# Patient Record
Sex: Female | Born: 1985 | Hispanic: Yes | Smoking: Never smoker
Health system: Southern US, Community
[De-identification: ages and names within clinical notes are randomized; demographics above are authoritative.]

## PROBLEM LIST (undated history)

## (undated) ENCOUNTER — Inpatient Hospital Stay (HOSPITAL_COMMUNITY): Payer: Self-pay

## (undated) ENCOUNTER — Emergency Department (HOSPITAL_COMMUNITY): Admission: EM | Disposition: A | Discharge status: Home / Self Care | Payer: Self-pay

## (undated) DIAGNOSIS — G43909 Migraine, unspecified, not intractable, without status migrainosus: Secondary | ICD-10-CM

## (undated) DIAGNOSIS — R3915 Urgency of urination: Secondary | ICD-10-CM

## (undated) DIAGNOSIS — K219 Gastro-esophageal reflux disease without esophagitis: Secondary | ICD-10-CM

## (undated) DIAGNOSIS — R350 Frequency of micturition: Secondary | ICD-10-CM

## (undated) DIAGNOSIS — R351 Nocturia: Secondary | ICD-10-CM

## (undated) DIAGNOSIS — M797 Fibromyalgia: Secondary | ICD-10-CM

## (undated) DIAGNOSIS — Z9889 Other specified postprocedural states: Secondary | ICD-10-CM

## (undated) DIAGNOSIS — N979 Female infertility, unspecified: Secondary | ICD-10-CM

## (undated) DIAGNOSIS — N3010 Interstitial cystitis (chronic) without hematuria: Secondary | ICD-10-CM

## (undated) DIAGNOSIS — M26629 Arthralgia of temporomandibular joint, unspecified side: Secondary | ICD-10-CM

## (undated) DIAGNOSIS — R112 Nausea with vomiting, unspecified: Secondary | ICD-10-CM

## (undated) DIAGNOSIS — L299 Pruritus, unspecified: Secondary | ICD-10-CM

## (undated) HISTORY — PX: OTHER SURGICAL HISTORY: SHX169

---

## 2005-12-25 ENCOUNTER — Other Ambulatory Visit: Admission: RE | Admit: 2005-12-25 | Discharge: 2005-12-25 | Payer: Self-pay | Admitting: Obstetrics and Gynecology

## 2006-01-11 ENCOUNTER — Inpatient Hospital Stay (HOSPITAL_COMMUNITY): Admission: AD | Admit: 2006-01-11 | Discharge: 2006-01-11 | Payer: Self-pay | Admitting: Obstetrics and Gynecology

## 2006-02-13 ENCOUNTER — Inpatient Hospital Stay (HOSPITAL_COMMUNITY): Admission: AD | Admit: 2006-02-13 | Discharge: 2006-02-13 | Payer: Self-pay | Admitting: Obstetrics and Gynecology

## 2006-04-19 ENCOUNTER — Inpatient Hospital Stay (HOSPITAL_COMMUNITY): Admission: AD | Admit: 2006-04-19 | Discharge: 2006-04-19 | Payer: Self-pay | Admitting: Obstetrics and Gynecology

## 2006-06-04 ENCOUNTER — Inpatient Hospital Stay (HOSPITAL_COMMUNITY): Admission: AD | Admit: 2006-06-04 | Discharge: 2006-06-05 | Payer: Self-pay | Admitting: Obstetrics and Gynecology

## 2006-07-06 ENCOUNTER — Inpatient Hospital Stay (HOSPITAL_COMMUNITY): Admission: AD | Admit: 2006-07-06 | Discharge: 2006-07-06 | Payer: Self-pay | Admitting: Obstetrics and Gynecology

## 2006-08-10 ENCOUNTER — Ambulatory Visit (HOSPITAL_COMMUNITY): Admission: RE | Admit: 2006-08-10 | Discharge: 2006-08-10 | Payer: Self-pay | Admitting: Obstetrics and Gynecology

## 2006-08-12 ENCOUNTER — Inpatient Hospital Stay (HOSPITAL_COMMUNITY): Admission: AD | Admit: 2006-08-12 | Discharge: 2006-08-15 | Payer: Self-pay | Admitting: Obstetrics and Gynecology

## 2006-08-12 ENCOUNTER — Encounter (INDEPENDENT_AMBULATORY_CARE_PROVIDER_SITE_OTHER): Payer: Self-pay | Admitting: Specialist

## 2006-08-16 ENCOUNTER — Inpatient Hospital Stay (HOSPITAL_COMMUNITY): Admission: AD | Admit: 2006-08-16 | Discharge: 2006-08-19 | Payer: Self-pay | Admitting: Obstetrics and Gynecology

## 2006-09-24 ENCOUNTER — Other Ambulatory Visit: Admission: RE | Admit: 2006-09-24 | Discharge: 2006-09-24 | Payer: Self-pay | Admitting: Obstetrics and Gynecology

## 2006-11-09 ENCOUNTER — Emergency Department (HOSPITAL_COMMUNITY): Admission: EM | Admit: 2006-11-09 | Discharge: 2006-11-09 | Payer: Self-pay | Admitting: Emergency Medicine

## 2006-11-18 ENCOUNTER — Emergency Department (HOSPITAL_COMMUNITY): Admission: EM | Admit: 2006-11-18 | Discharge: 2006-11-18 | Payer: Self-pay | Admitting: Emergency Medicine

## 2007-01-23 ENCOUNTER — Emergency Department (HOSPITAL_COMMUNITY): Admission: EM | Admit: 2007-01-23 | Discharge: 2007-01-23 | Payer: Self-pay | Admitting: Emergency Medicine

## 2008-01-17 ENCOUNTER — Emergency Department (HOSPITAL_COMMUNITY): Admission: EM | Admit: 2008-01-17 | Discharge: 2008-01-18 | Payer: Self-pay | Admitting: Emergency Medicine

## 2008-06-03 ENCOUNTER — Emergency Department (HOSPITAL_COMMUNITY): Admission: EM | Admit: 2008-06-03 | Discharge: 2008-06-04 | Payer: Self-pay | Admitting: Emergency Medicine

## 2008-06-04 ENCOUNTER — Emergency Department (HOSPITAL_COMMUNITY): Admission: EM | Admit: 2008-06-04 | Discharge: 2008-06-05 | Payer: Self-pay | Admitting: Emergency Medicine

## 2008-06-21 ENCOUNTER — Ambulatory Visit (HOSPITAL_BASED_OUTPATIENT_CLINIC_OR_DEPARTMENT_OTHER): Admission: RE | Admit: 2008-06-21 | Discharge: 2008-06-21 | Payer: Self-pay | Admitting: Urology

## 2008-09-30 ENCOUNTER — Emergency Department (HOSPITAL_COMMUNITY): Admission: EM | Admit: 2008-09-30 | Discharge: 2008-09-30 | Payer: Self-pay | Admitting: Emergency Medicine

## 2008-10-21 ENCOUNTER — Emergency Department (HOSPITAL_COMMUNITY): Admission: EM | Admit: 2008-10-21 | Discharge: 2008-10-21 | Payer: Self-pay | Admitting: Emergency Medicine

## 2008-11-18 ENCOUNTER — Emergency Department (HOSPITAL_COMMUNITY): Admission: EM | Admit: 2008-11-18 | Discharge: 2008-11-18 | Payer: Self-pay | Admitting: Family Medicine

## 2008-12-31 ENCOUNTER — Emergency Department (HOSPITAL_COMMUNITY): Admission: EM | Admit: 2008-12-31 | Discharge: 2008-12-31 | Payer: Self-pay | Admitting: Emergency Medicine

## 2009-02-14 ENCOUNTER — Emergency Department (HOSPITAL_COMMUNITY): Admission: EM | Admit: 2009-02-14 | Discharge: 2009-02-14 | Payer: Self-pay | Admitting: Emergency Medicine

## 2009-05-01 ENCOUNTER — Ambulatory Visit (HOSPITAL_COMMUNITY): Admission: RE | Admit: 2009-05-01 | Discharge: 2009-05-01 | Payer: Self-pay | Admitting: Obstetrics and Gynecology

## 2009-07-19 ENCOUNTER — Emergency Department (HOSPITAL_COMMUNITY): Admission: EM | Admit: 2009-07-19 | Discharge: 2009-07-20 | Payer: Self-pay | Admitting: Emergency Medicine

## 2009-08-21 ENCOUNTER — Emergency Department (HOSPITAL_COMMUNITY): Admission: EM | Admit: 2009-08-21 | Discharge: 2009-08-21 | Payer: Self-pay | Admitting: Family Medicine

## 2009-10-12 ENCOUNTER — Encounter: Payer: Self-pay | Admitting: Internal Medicine

## 2009-10-12 ENCOUNTER — Emergency Department (HOSPITAL_COMMUNITY): Admission: EM | Admit: 2009-10-12 | Discharge: 2009-10-12 | Payer: Self-pay | Admitting: Emergency Medicine

## 2010-01-04 IMAGING — CR DG CHEST 2V
2 series · 2 of 2 positions shown · non-contrast
Comparison: 11/18/2006

CLINICAL DATA: Nausea, fever, flu.

CHEST - 2 VIEW

[w chest pa]
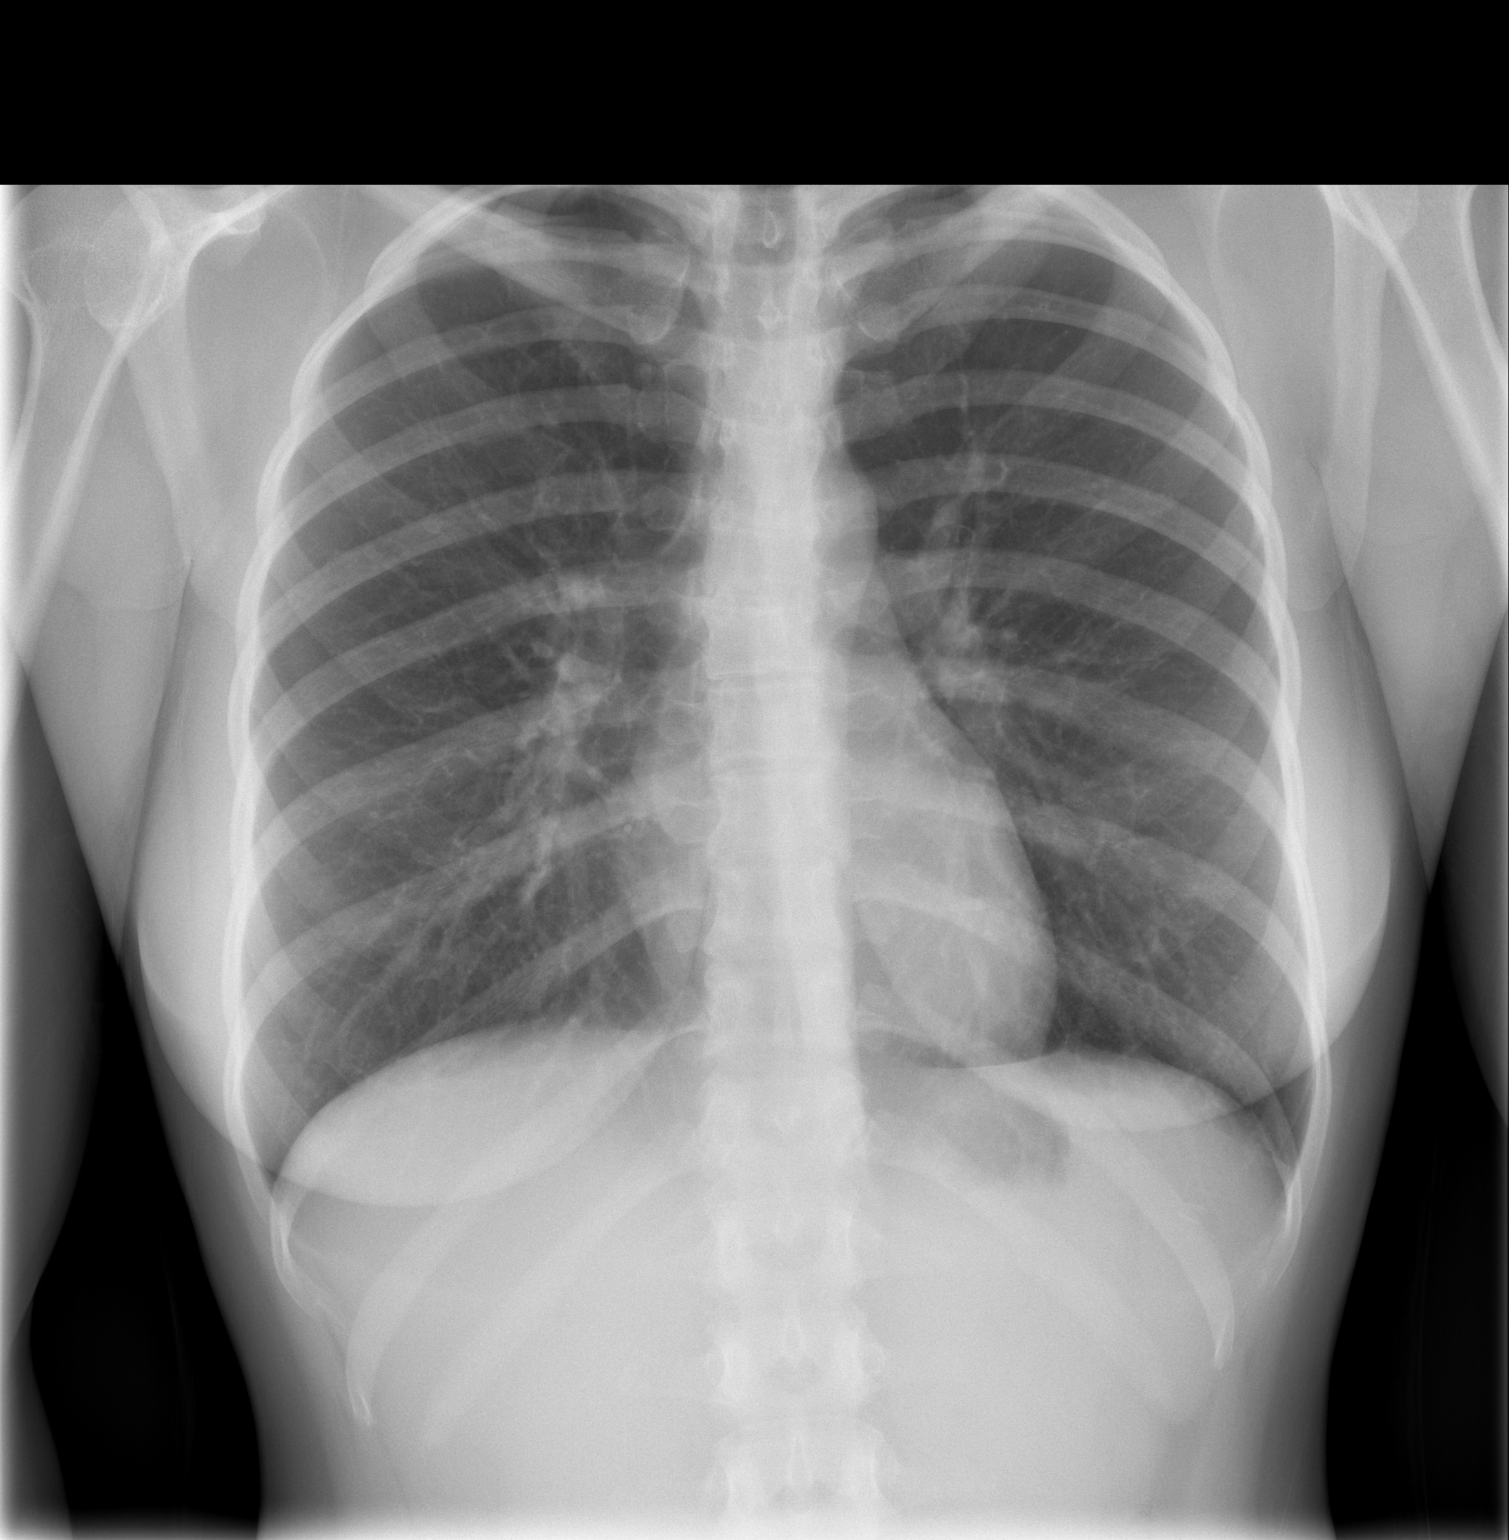

[w chest lat]
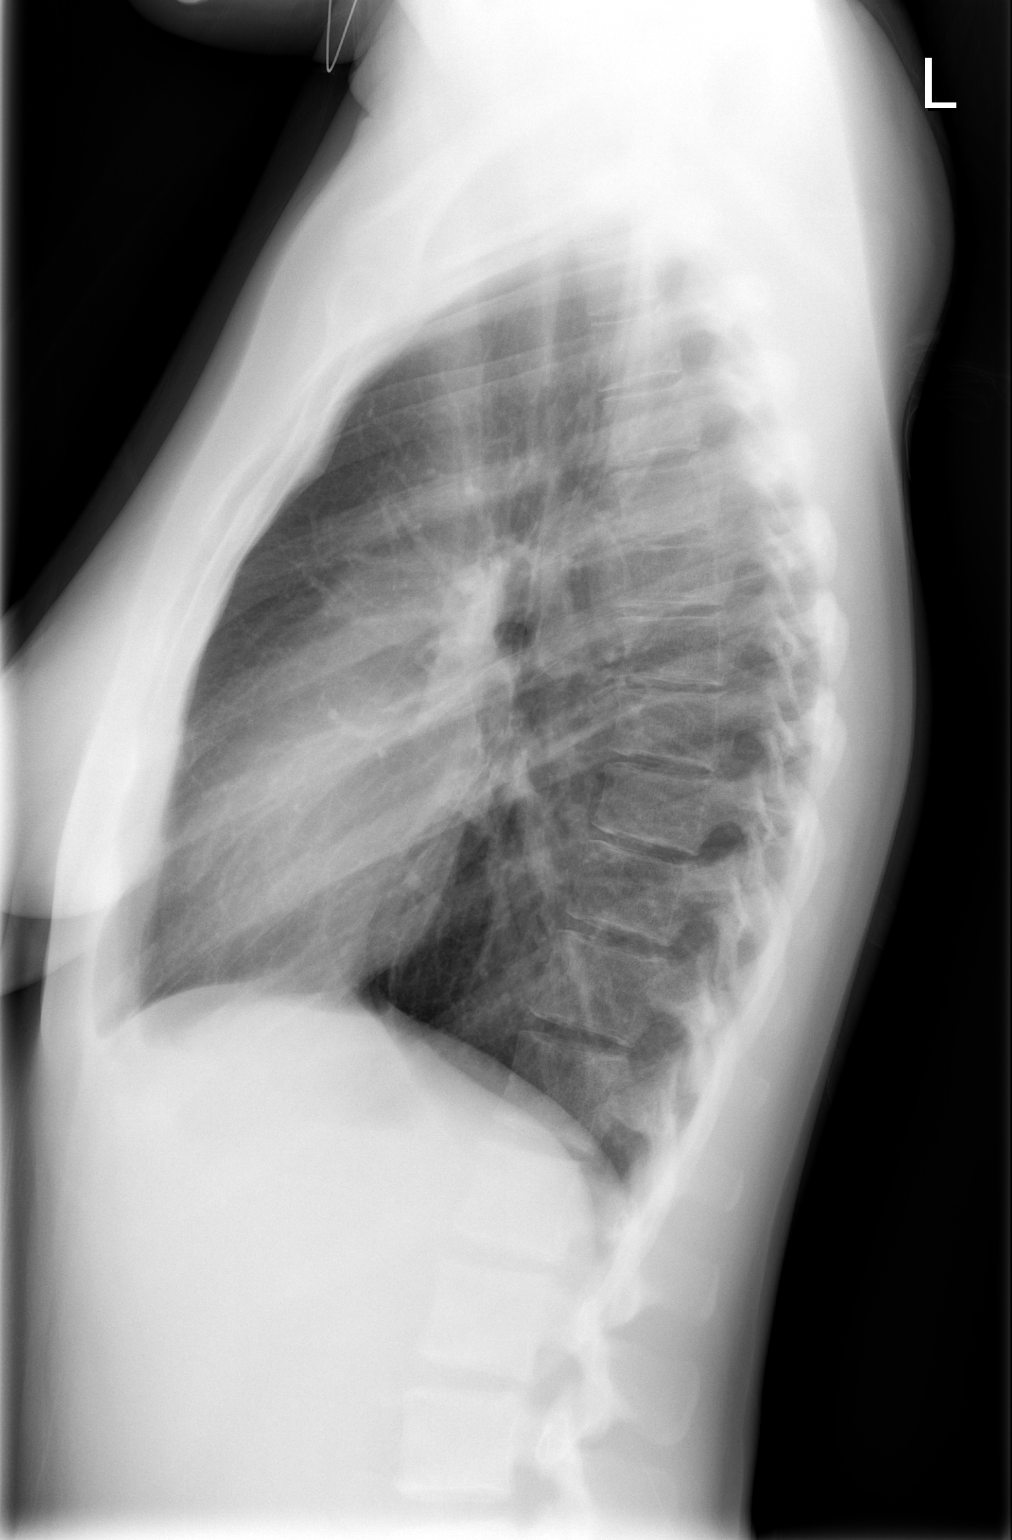

[2 of 2 positions shown; findings below may reference images not displayed]

FINDINGS: Heart and mediastinal contours are within normal limits.
No focal opacities or effusions.  No acute bony abnormality.
IMPRESSION: No active disease.

## 2010-03-13 ENCOUNTER — Ambulatory Visit (HOSPITAL_BASED_OUTPATIENT_CLINIC_OR_DEPARTMENT_OTHER): Admission: RE | Admit: 2010-03-13 | Discharge: 2010-03-13 | Payer: Self-pay | Admitting: Urology

## 2010-03-19 ENCOUNTER — Emergency Department (HOSPITAL_COMMUNITY): Admission: EM | Admit: 2010-03-19 | Discharge: 2010-03-19 | Payer: Self-pay | Admitting: Family Medicine

## 2010-07-11 ENCOUNTER — Encounter: Payer: Self-pay | Admitting: Internal Medicine

## 2010-07-13 ENCOUNTER — Encounter: Payer: Self-pay | Admitting: Internal Medicine

## 2010-07-30 ENCOUNTER — Encounter (INDEPENDENT_AMBULATORY_CARE_PROVIDER_SITE_OTHER): Payer: Self-pay | Admitting: *Deleted

## 2010-07-30 DIAGNOSIS — B370 Candidal stomatitis: Secondary | ICD-10-CM

## 2010-07-31 DIAGNOSIS — M26609 Unspecified temporomandibular joint disorder, unspecified side: Secondary | ICD-10-CM | POA: Insufficient documentation

## 2010-07-31 DIAGNOSIS — B343 Parvovirus infection, unspecified: Secondary | ICD-10-CM | POA: Insufficient documentation

## 2010-07-31 DIAGNOSIS — Z87448 Personal history of other diseases of urinary system: Secondary | ICD-10-CM | POA: Insufficient documentation

## 2010-07-31 DIAGNOSIS — K219 Gastro-esophageal reflux disease without esophagitis: Secondary | ICD-10-CM

## 2010-08-05 ENCOUNTER — Encounter: Payer: Self-pay | Admitting: Internal Medicine

## 2010-08-08 ENCOUNTER — Emergency Department (HOSPITAL_BASED_OUTPATIENT_CLINIC_OR_DEPARTMENT_OTHER): Admission: EM | Admit: 2010-08-08 | Discharge: 2010-08-08 | Payer: Self-pay | Admitting: Emergency Medicine

## 2010-08-08 ENCOUNTER — Ambulatory Visit: Payer: Self-pay | Admitting: Internal Medicine

## 2010-08-08 DIAGNOSIS — IMO0001 Reserved for inherently not codable concepts without codable children: Secondary | ICD-10-CM

## 2010-08-08 DIAGNOSIS — K120 Recurrent oral aphthae: Secondary | ICD-10-CM | POA: Insufficient documentation

## 2010-08-08 DIAGNOSIS — D72819 Decreased white blood cell count, unspecified: Secondary | ICD-10-CM | POA: Insufficient documentation

## 2010-08-08 DIAGNOSIS — R198 Other specified symptoms and signs involving the digestive system and abdomen: Secondary | ICD-10-CM

## 2010-08-13 ENCOUNTER — Encounter: Payer: Self-pay | Admitting: Internal Medicine

## 2010-08-19 ENCOUNTER — Telehealth: Payer: Self-pay

## 2010-08-26 ENCOUNTER — Encounter: Payer: Self-pay | Admitting: Internal Medicine

## 2010-08-28 ENCOUNTER — Telehealth: Payer: Self-pay | Admitting: Internal Medicine

## 2010-09-05 ENCOUNTER — Encounter: Payer: Self-pay | Admitting: Internal Medicine

## 2010-12-10 NOTE — Miscellaneous (Signed)
Summary: Dr. Marta Antu  Dr. Marta Antu   Imported By: Florinda Marker 09/05/2010 15:12:44  _____________________________________________________________________  External Attachment:    Type:   Image     Comment:   External Document

## 2010-12-10 NOTE — Consult Note (Signed)
[facility]   Imported By: Florinda Marker 09/11/2010 09:13:32  _____________________________________________________________________  External Attachment:    Type:   Image     Comment:   External Document

## 2010-12-10 NOTE — Miscellaneous (Signed)
Summary: HIPAA Restrictions  HIPAA Restrictions   Imported By: Florinda Marker 08/08/2010 16:03:45  _____________________________________________________________________  External Attachment:    Type:   Image     Comment:   External Document

## 2010-12-10 NOTE — Cardiovascular Report (Signed)
Summary: New Pt. Referral: [facility]  New Pt. Referral: [facility]   Imported By: Florinda Marker 08/26/2010 10:30:19  _____________________________________________________________________  External Attachment:    Type:   Image     Comment:   External Document

## 2010-12-10 NOTE — Assessment & Plan Note (Signed)
Summary: New Pt Persistent Thrush, Leukopenia, frequent infectons/kdw   CC:  follow-up on possible auto-immune problems and pt. has not slept in 3 days.  History of Present Illness: [name] is a 25 year old who is referred to me by Dr. Thayer Headings for evaluation of recurrent infections and leukopenia.  She states that she first became ill in January 2009.  She recalls having an upper respiratory tract infection.  Since that time she's had near continuous problems with painful mouth ulcers, myalgias, more frequent bowel movements, occasional nausea and vomiting, and headache.  She is also bothered by urinary frequency often going every hour during the day and 3 to 4 times at night.  When I asked her how many doctors she has seen [place] since she became ill she replied, "who haven't I seen?" She is followed by Dr. Isabel Caprice for possible interstitial cystis. she used to see Dr. Dimple Casey, a primary care doctor with [facility] but now sees Dr. Thea Silversmith.   She saw a neurologist with [facility]. She saw Dr. Allison Quarry hematologist with [facility]. She recalls that he said that she did not have any type of cancer and that she probably had some type of autoimmune disorder.  Dr. Matthias Hughs, her gastroenterologist, told her that she probably did not have inflammatory bowel disease.  He mentioned to her that she might have Bechets syndrome. Most recently she was seen by Dr. Tobie Poet at Dr. Charyl Dancer in [facility] who told her that he was not sure what she had.  However he did try a prednisone taper starting at 60 mg down to 10 mg over one week.  She says that she felt miraculously better while on prednisone.  Her mouth ulcers cleared, her myalgias resolved, and her frequent urination and bowel movements improved dramatically.  She is now feeling worse again off of prednisone.  She tried a variety of over-the-counter anti-inflammatory agents without improvement.  She has tried some type of topical preparation for  the ulcers but that did not help either.  She is not any fever, chills, or sweats.  She denied any change in her appetite or weight.  She is married and believe she is in a mutually monogamous relationship with her husband.  She has been tested recently for HIV and was negative.  She has never had any sexual transmitted disease that she knows of.  Her husband and 31-year-old daughter are in good health.  Her mother has breast cancer but she has been screened and she is negative for the [facility] gene. She does not know of any significant history of rheumatologic disorders in her family.  Her white blood cell count has been consistently low over the past 2 1/2 years with a slight granulocyte predominance.  She has a normal hemoglobin and platelet count.  Her liver enzymes are normal and her renal function is normal.  Thyroid studies have been normal.  Urinalyses have been normal.  Her sed rate was 10, her rheumatoid factor was negative and her ANA was negative.  A bone marrow biopsy done on August 2 showed an active cellular marrow with normal trilineage maturation without atypical lymphoid infiltrate or malignancy.  Preventive Screening-Counseling & Management  Alcohol-Tobacco     Alcohol drinks/day: 0     Smoking Status: never  Caffeine-Diet-Exercise     Caffeine use/day: no     Does Patient Exercise: yes     Type of exercise: running and eliptical  Safety-Violence-Falls     Seat Belt Use: yes  Prior Medication List:  No prior medications documented  Current Allergies (reviewed today): ! OXYCODONE HCL (OXYCODONE HCL) ! * PROPOXYPHENE Review of Systems       The patient complains of headaches.  The patient denies anorexia, fever, weight loss, weight gain, decreased hearing, chest pain, syncope, dyspnea on exertion, prolonged cough, abdominal pain, severe indigestion/heartburn, hematuria, incontinence, genital sores, muscle weakness, difficulty walking, depression, and enlarged lymph nodes.     Vital Signs:  Patient profile:   25 year old female Height:      63 inches (160.02 cm) Weight:      140.5 pounds (63.86 kg) BMI:     24.98 Temp:     98.7 degrees F (37.06 degrees C) oral Pulse rate:   71 / minute BP sitting:   118 / 82  (left arm) Cuff size:   regular  Vitals Entered By: Jennet Maduro RN (August 08, 2010 2:15 PM) CC: follow-up on possible auto-immune problems, pt. has not slept in 3 days Is Patient Diabetic? No Pain Assessment Patient in pain? yes     Location: body aches everywhere Intensity: 10 Type: aching Onset of pain  Constant Nutritional Status BMI of 19 -24 = normal Nutritional Status Detail appetite "bad sore throat and mouth ulcers, only eatting soup"  Have you ever been in a relationship where you felt threatened, hurt or afraid?not asked today   Does patient need assistance? Functional Status Self care Ambulation Normal   Physical Exam  General:  alert and well-nourished.   Eyes:  pupils equal, pupils round, corneas and lenses clear, and no injection.   Nose:  no nasal mucosal lesions.   Mouth:  good dentition, pharynx pink and moist, no erythema, and no exudates.  she does have pictures on her cell phone which show large ulcers on her buccal mucosa and tongue.  She also has two pictures which shows some faint palmar erythema. Lungs:  normal breath sounds, no crackles, and no wheezes.   Heart:  normal rate, regular rhythm, and no murmur.   Abdomen:  soft, non-tender, normal bowel sounds, no masses, no hepatomegaly, and no splenomegaly.   Msk:  normal ROM, no joint tenderness, no joint swelling, no joint warmth, and no redness over joints.   Neurologic:  gait normal.   Skin:  no rashes and no suspicious lesions.   Cervical Nodes:  no anterior cervical adenopathy and no posterior cervical adenopathy.   Axillary Nodes:  no R axillary adenopathy and no L axillary adenopathy.   Psych:  normally interactive, good eye contact, not anxious  appearing, and not depressed appearing, but she does appear worried.     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  I've asked her to call back with the name of the medication she has been using for her oral ulcers (without success) and I will try to obtain records from Dr. Allison Quarry, Dr. Matthias Hughs and Dr. Tobie Poet.   I will call her after I review these records to give her my final recommendations.  Although she is seeing a great number of doctors it may be helpful to have her follow up with complete records with a rheumatologist. Orders: Consultation Level IV ([id number])  Patient Instructions: 1)  Please schedule a follow-up appointment in 1 month.

## 2010-12-10 NOTE — Progress Notes (Signed)
  Phone Note Outgoing Call   Call placed by: Cliffton Asters MD,  August 28, 2010 1:43 PM Details for Reason: medical records from other doctors. Summary of Call: I reviewed Dr. Augustin Schooling records. He dx apthus ulcers and prescribed a prednisone dose pack pending visit with "immunologist". Dr. 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 Dr. Matthias Hughs considered Bechets syndrome and recommended rheumatology referral. I called the home and work numbers listed for Ms. Gordan and both were out of order. I agree with a rheumatology evaluation. She is scheduled for a f/u appt with me on 11/8.

## 2010-12-10 NOTE — Consult Note (Signed)
Summary: [facility] [employer] [facility]   Imported By: Florinda Marker 09/11/2010 14:40:57  _____________________________________________________________________  External Attachment:    Type:   Image     Comment:   External Document

## 2010-12-10 NOTE — Consult Note (Signed)
Summary: [facility]  [facility]   Imported By: Florinda Marker 09/11/2010 12:02:48  _____________________________________________________________________  External Attachment:    Type:   Image     Comment:   External Document

## 2010-12-10 NOTE — Consult Note (Signed)
Summary: [facility]   Imported By: Florinda Marker 09/11/2010 09:14:15  _____________________________________________________________________  External Attachment:    Type:   Image     Comment:   External Document

## 2010-12-10 NOTE — Progress Notes (Signed)
Summary: Medical records request.  Phone Note Outgoing Call   Call placed by: Tomasita Morrow RN,  August 19, 2010 3:14 PM Summary of Call: Medical Records requested from the following physicians:   Dr Fredderick Severance, Dr Vallathucherry Allison Quarry, Dr Barron Alvine Dr Bernette Redbird, Dr Tobie Poet . Tomasita Morrow RN  August 19, 2010 3:18 PM   Initial call taken by: Tomasita Morrow RN,  August 19, 2010 3:18 PM

## 2010-12-10 NOTE — Miscellaneous (Signed)
Summary: Problems, Medications and Allergies  Clinical Lists Changes  Problems: Added new problem of OTHER DECREASED WHITE BLOOD CELL COUNT (ICD-288.59) Added new problem of THRUSH (ICD-112.0) - persistent Added new problem of CYSTITIS, HX OF (ICD-V13.09) - 2009 Added new problem of URI (ICD-465.9) - hx of Added new problem of PARVOVIRUS B19 (ICD-079.83) - hx of Added new problem of UTI'S, HX OF (ICD-V13.00) Added new problem of GERD (ICD-530.81) - HX of Added new problem of TMJ SYNDROME (ICD-524.60) - HX of Added new problem of DIARRHEA, ACUTE (ICD-787.91) Allergies: Added new allergy or adverse reaction of OXYCODONE HCL (OXYCODONE HCL) Added new allergy or adverse reaction of * PROPOXYPHENE Observations: Added new observation of NKA: F (07/30/2010 11:31)

## 2011-01-09 ENCOUNTER — Other Ambulatory Visit (HOSPITAL_COMMUNITY)
Admission: RE | Admit: 2011-01-09 | Discharge: 2011-01-09 | Disposition: A | Discharge status: Home / Self Care | Payer: BC Managed Care – PPO | Source: Ambulatory Visit | Attending: Internal Medicine | Admitting: Internal Medicine

## 2011-01-09 ENCOUNTER — Other Ambulatory Visit: Payer: Self-pay | Admitting: Internal Medicine

## 2011-01-09 DIAGNOSIS — Z01419 Encounter for gynecological examination (general) (routine) without abnormal findings: Secondary | ICD-10-CM | POA: Insufficient documentation

## 2011-01-09 DIAGNOSIS — Z1159 Encounter for screening for other viral diseases: Secondary | ICD-10-CM | POA: Insufficient documentation

## 2011-01-28 LAB — POCT HEMOGLOBIN-HEMACUE: Hemoglobin: 13 g/dL (ref 12.0–15.0)

## 2011-01-28 LAB — POCT PREGNANCY, URINE: Preg Test, Ur: NEGATIVE

## 2011-02-11 LAB — WET PREP, GENITAL
Clue Cells Wet Prep HPF POC: NONE SEEN
Trich, Wet Prep: NONE SEEN
Yeast Wet Prep HPF POC: NONE SEEN

## 2011-02-11 LAB — URINALYSIS, ROUTINE W REFLEX MICROSCOPIC
Bilirubin Urine: NEGATIVE
Nitrite: NEGATIVE

## 2011-02-11 LAB — CBC
MCHC: 34.4 g/dL (ref 30.0–36.0)
Platelets: 190 10*3/uL (ref 150–400)
RBC: 4.47 MIL/uL (ref 3.87–5.11)

## 2011-02-11 LAB — DIFFERENTIAL
Basophils Absolute: 0 10*3/uL (ref 0.0–0.1)
Basophils Relative: 1 % (ref 0–1)
Monocytes Relative: 6 % (ref 3–12)
Neutro Abs: 2 10*3/uL (ref 1.7–7.7)
Neutrophils Relative %: 54 % (ref 43–77)

## 2011-02-11 LAB — BASIC METABOLIC PANEL
CO2: 27 mEq/L (ref 19–32)
Calcium: 8.9 mg/dL (ref 8.4–10.5)
Creatinine, Ser: 0.7 mg/dL (ref 0.4–1.2)
GFR calc Af Amer: >60 mL/min (ref >60)

## 2011-02-11 LAB — PREGNANCY, URINE: Preg Test, Ur: NEGATIVE

## 2011-02-11 LAB — GC/CHLAMYDIA PROBE AMP, GENITAL: Chlamydia, DNA Probe: NEGATIVE

## 2011-02-11 LAB — URINE MICROSCOPIC-ADD ON

## 2011-02-14 LAB — GC/CHLAMYDIA PROBE AMP, GENITAL
Chlamydia, DNA Probe: NEGATIVE
GC Probe Amp, Genital: NEGATIVE

## 2011-02-14 LAB — WET PREP, GENITAL
Clue Cells Wet Prep HPF POC: NONE SEEN
Trich, Wet Prep: NONE SEEN
Yeast Wet Prep HPF POC: NONE SEEN

## 2011-02-14 LAB — URINALYSIS, ROUTINE W REFLEX MICROSCOPIC
Bilirubin Urine: NEGATIVE
Glucose, UA: NEGATIVE mg/dL
Hgb urine dipstick: NEGATIVE
Specific Gravity, Urine: 1.022 (ref 1.005–1.030)
pH: 8.5 — ABNORMAL HIGH (ref 5.0–8.0)

## 2011-02-24 LAB — POCT URINALYSIS DIP (DEVICE)
Glucose, UA: NEGATIVE mg/dL
Nitrite: NEGATIVE
Protein, ur: 30 mg/dL — AB
Urobilinogen, UA: 0.2 mg/dL (ref 0.0–1.0)

## 2011-02-24 LAB — POCT PREGNANCY, URINE: Preg Test, Ur: NEGATIVE

## 2011-03-10 ENCOUNTER — Ambulatory Visit (HOSPITAL_BASED_OUTPATIENT_CLINIC_OR_DEPARTMENT_OTHER)
Admission: RE | Admit: 2011-03-10 | Discharge: 2011-03-10 | Disposition: A | Discharge status: Home / Self Care | Payer: BC Managed Care – PPO | Source: Ambulatory Visit | Attending: Urology | Admitting: Urology

## 2011-03-10 DIAGNOSIS — Z01812 Encounter for preprocedural laboratory examination: Secondary | ICD-10-CM | POA: Insufficient documentation

## 2011-03-10 DIAGNOSIS — N3010 Interstitial cystitis (chronic) without hematuria: Secondary | ICD-10-CM | POA: Insufficient documentation

## 2011-03-10 LAB — POCT PREGNANCY, URINE: Preg Test, Ur: NEGATIVE

## 2011-03-19 NOTE — Op Note (Signed)
  NAMESHRUTHI, [name]             ACCOUNT NO.:  [id number]  MEDICAL RECORD NO.:  [id number]           PATIENT TYPE:  LOCATION:                                 FACILITY:  PHYSICIAN:  Karen Meyer, M.D.  DATE OF BIRTH:  1986/10/23  DATE OF PROCEDURE: DATE OF DISCHARGE:                              OPERATIVE REPORT   PREOPERATIVE DIAGNOSIS:  Interstitial cystitis.  POSTOPERATIVE DIAGNOSIS:  Interstitial cystitis.  PROCEDURE FORMED:  Cystoscopy with hydraulic overdistention of the bladder and instillation of Clorpactin and Marcaine.  SURGEON:  Karen Meyer, M.D.  ANESTHESIA:  General.  INDICATIONS:  Karen Meyer is 25 years of age.  She has had longstanding complaints of pelvic pain, bladder overactivity, and symptoms consistent with interstitial cystitis.  The patient has had 2 previous hydraulic overdistention of the bladder, the last occurring approximately a year ago with excellent improvement.  Previous capacity was noted to be 500 to 600 cc with diffuse evidence of glomerular hemorrhaging.  She has recently had a significant flare-up in her bladder pain and wanted a repeat hydraulic overdistention.  TECHNIQUE AND FINDINGS:  The patient was brought to the operating room. She had successful induction of general anesthesia, was placed in lithotomy position, prepped and draped in usual manner.  She received perioperative ciprofloxacin.  Appropriate surgical time-out was performed.  Initial bladder cystoscopy was unremarkable.  She underwent hydraulic overdistention of the bladder to 80 to 100 cm water pressure for 5 minutes.  Capacity was fairly stable at 550 cc with diffuse 4+ glomerular hemorrhaging and quite bloody effluent from her bladder at the end of the distention.  She underwent a repeat distention for an additional several minutes.  The bladder was then drained.  Clorpactin was instilled by gravity drainage a few hundred cc's at a time and then her bladder was  again drained.  A 30 cc of 0.25% Marcaine was then instilled.  The patient appeared to have no obvious complications and was brought to recovery room in stable condition.     Karen Meyer, M.D.     DSG/MEDQ  D:  03/10/2011  T:  03/10/2011  Job:  [id number]  Electronically Signed by Barron Alvine M.D. on 03/19/2011 08:44:41 AM

## 2011-03-28 NOTE — Op Note (Signed)
Karen Meyer, Karen Meyer             ACCOUNT NO.:  [id number]   MEDICAL RECORD NO.:  [id number]          PATIENT TYPE:  INP   LOCATION:  9104                          FACILITY:  [facility]   PHYSICIAN:  Rudy Jew. Ashley Royalty, M.D.DATE OF BIRTH:  10-Aug-1986   DATE OF PROCEDURE:  08/12/2006  DATE OF DISCHARGE:                                 OPERATIVE REPORT   PREOPERATIVE DIAGNOSIS:  1. Intrauterine pregnancy at [redacted] weeks gestation.  2. Breech presentation.   POSTOPERATIVE DIAGNOSES:  1. Intrauterine pregnancy at [redacted] weeks gestation.  2. Breech presentation.   PROCEDURE:  Primary low transverse cesarean section.   SURGEON:  Rudy Jew. Ashley Royalty, M.D.   ANESTHESIA:  Spinal.   ESTIMATED BLOOD LOSS:  Was 300 mL.   FINDINGS:  A 7 pound and 15 ounce female, with Apgars of 9 at one minute and  9 at five minutes, sent to newborn nursery.   COMPLICATIONS:  None.   PACKS/DRAINS:  Foley.   The sponge, needle and instrument counts reported as correct x2.   DESCRIPTION OF PROCEDURE:  The patient taken to the operating room  and  placed in the sitting position.  After spinal anesthetic was administered,  she was placed in the dorsal supine position and prepped and draped in the  usual manner for abdominal surgery.  A Foley catheter was placed.  The  Pfannenstiel incision was made down to the level of the fascia, which was  nicked with knife and incised transversely with Mayo scissors.  The  underlying rectus muscles were separated from the overlying fascia using  sharp and blunt dissection.  The rectus muscles were separated  in the  midline, exposing the peritoneum which was elevated with hemostats and  entered atraumatically with Metzenbaum scissors.  The incision was extended  longitudinally.  The uterus was identified and a bladder flap created by  incising the intrauterine serosa and sharply and bluntly dissecting the  bladder inferiorly.  It was held in place with a bladder blade.  The uterus  was then entered through a low transverse incision using sharp and blunt  dissection.  The infant was delivered from a frank breech presentation in an  atraumatic manner.  The infant was suctioned.  The cord was doubly clamped,  cut and the infant given immediately to the awaiting pediatrics team.  Next,  the placenta and membranes were removed and submitted to pathology for  histologic studies.  The uterus was exteriorized.   The uterus was then closed in two running layers of #1 Vicryl.  The first  was a running locking layer.  The second was a running, intermittently  locking and imbricating layer.  One additional figure-of-eight suture was  required to obtain hemostasis.  Hemostasis was noted.   Next, the uterus, tubes and ovaries were returned to the abdominal cavity.  Copious irrigation was accomplished.  Hemostasis was noted.  The peritoneum  was then closed with #3-0 Vicryl in a running fashion.  The fascia was  closed with #0 Vicryl in a running fashion.  The skin was closed with  staples.   The patient tolerated  the procedure extremely well and was returned to the  recovery room in good condition.  At the conclusion of procedure the urine  was clear and copious.      James A. Ashley Royalty, M.D.  Electronically Signed     JAM/MEDQ  D:  08/12/2006  T:  08/13/2006  Job:  [id number]

## 2011-03-28 NOTE — Discharge Summary (Signed)
[name], Karen Meyer             ACCOUNT NO.:  [id number]   MEDICAL RECORD NO.:  [id number]          PATIENT TYPE:  INP   LOCATION:  9305                          [facility]ACILITY[facility]   PHYSICIAN:  James A. Ashley Royalty, M.D.DATE OF BIRTH:  01/30/1986   DATE OF ADMISSION:  08/17/2006  DATE OF DISCHARGE:  08/19/2006                                 DISCHARGE SUMMARY   DISCHARGE DIAGNOSES:  1. Status post cesarean section August 12, 2006.  2. Preeclampsia - severe.   OPERATION OR SPECIAL PROCEDURES:  None.   CONSULTATIONS:  Internal fetal medicine - Dr. Ander Slade.   DISCHARGE MEDICINES:  As prescribed at the previous discharge.   HISTORY AND PHYSICAL:  This is a 19-year gravida 1, para 1 status post  cesarean section for breech presentation August 12, 2006.  Postoperative  course was benign except for asymptomatic anemia.  The patient was  discharged home August 15, 2006.   The patient presented to maternity admissions complaining of dependent edema  on or about August 17, 2006.  She denied any headache, fetal disturbance,  right upper quadrant or epigastric pain.  On exam, she demonstrated 1+  edema.  PIH panel revealed SGOT of 95, SGPT of 120, hemoglobin 8.2.   Due to the elevation of the patient's liver enzymes, it was felt prudent to  place her on 23-hour observation and consult maternal fetal medicine.  Dr.  Ander Slade evaluated the patient August 17, 2006.  He agreed with the diagnosis of  atypical preeclampsia - severe.  The patient was subsequently given  magnesium sulfate.  Repeat PIH panel was obtained on August 19, 2006, and  the SGOT had dropped to 46 and SGPT had dropped to 77.  The patient was felt  to be a candidate for discharge and was discharged home afebrile and in  satisfactory condition.   DISPOSITION:  The patient is to return to the office in 1 week for further  evaluation and therapy.      James A. Ashley Royalty, M.D.  Electronically Signed     JAM/MEDQ  D:  09/03/2006   T:  09/04/2006  Job:  [id number]

## 2011-03-28 NOTE — Discharge Summary (Signed)
[name], Karen Meyer             ACCOUNT NO.:  [id number]   MEDICAL RECORD NO.:  [id number]          PATIENT TYPE:  INP   LOCATION:  9104                          [facility]   PHYSICIAN:  Rudy Jew. Ashley Royalty, M.D.DATE OF BIRTH:  [birth year]   DATE OF ADMISSION:  08/12/2006  DATE OF DISCHARGE:  08/15/2006                                 DISCHARGE SUMMARY   DISCHARGE DIAGNOSES:  1. Intrauterine pregnancy at term, delivered.  2. Breech presentation.  3. Anemia - asymptomatic.   OPERATIONS AND SPECIAL PROCEDURES:  Primary low transverse cesarean section.   CONSULTATIONS:  None.   DISCHARGE MEDICATIONS:  Darvocet, Motrin 600 mg, Chromagen.   HISTORY AND PHYSICAL:  54 primigravida 40 weeks, 3 days gestation.  Prenatal  care was essentially uncomplicated.  The patient had a breech presentation.  She was admitted for a C-section secondary to same.  For the remainder of  that history and physical, please see the chart.   HOSPITAL COURSE:  The patient was admitted to [facility]  [place].  Initial laboratory studies were drawn.  On August 12, 2006, she  was taken to the operating room and underwent primary low transverse  cesarean section.  The procedure yielded a 7-pound 15-ounce female, Apgars 9  at 1 minute, 9 at 5 minutes, sent to the newborn nursery.  The procedure was  uncomplicated.  The patient's postoperative course was characterized only by  an asymptomatic anemia.  On August 15, 2006, the patient was felt to be a  candidate for discharge and was discharged home afebrile and in satisfactory  condition.   DISPOSITION:  The patient is to return to Gynecology and Obstetrics in 4 to  6 weeks for postoperative appointment.      James A. Ashley Royalty, M.D.  Electronically Signed     JAM/MEDQ  D:  09/03/2006  T:  09/04/2006  Job:  [id number]

## 2011-03-28 NOTE — Op Note (Signed)
Karen Meyer, Karen Meyer             ACCOUNT NO.:  [id number]   MEDICAL RECORD NO.:  [id number]          PATIENT TYPE:  AMB   LOCATION:  [facility]                         FACILITY:  [facility]   PHYSICIAN:  Valetta Fuller, M.D.  DATE OF BIRTH:  1986/04/26   DATE OF PROCEDURE:  DATE OF DISCHARGE:                               OPERATIVE REPORT   PREOPERATIVE DIAGNOSES:  1. Chronic pelvic pain.  2. Urinary frequency.  3. Neurogenic bladder.   POSTOPERATIVE DIAGNOSES:  1. Chronic pelvic pain.  2. Urinary frequency.  3. Neurogenic bladder.   PROCEDURE PERFORMED:  Cystoscopy with hydraulic overdistention of the  bladder.   SURGEON:  Dr. Isabel Caprice.   ANESTHESIA:  General.   INDICATIONS:  Karen Meyer is a 25 year old female.  She came in with a 3-  4 month history of significant nocturia and frequency, and more recent  constant bladder pressure and pelvic discomfort.  Urinalysis has been  completely unremarkable.  Video urodynamics was markedly abnormal.  The  patient had marked sensory urgency and was only able to accommodate 1-2  ounces in her bladder before she had significant discomfort.  She also  had considerable bladder instability with objective stress incontinence  and again significant uninhibited bladder contractions.  She has no  known neurologic problems, but has been referred to Dr. Avie Echevaria for a  neurologic evaluation.  She now presents for cystoscopic assessment with  careful measurement of bladder capacity under anesthesia and assessment  for possible interstitial cystitis.   OPERATIVE TECHNIQUE AND FINDINGS:  The patient was brought to the  operating room where she had successful induction of general anesthesia.  She was placed in lithotomy position and prepped and draped in the usual  manner.  Initial cystoscopy revealed an unremarkable bladder without  evidence of any pathology.  No evidence of inflammation.  Initial  capacity was felt to be approximately 600 mL.  The  patient underwent  hydraulic overdistention of the bladder for 5 minutes at 80-100 cm of  water pressure.  Capacity after hydraulic overdistention was 800 mL.  The patient did have some diffuse 2-3+ glomerular hemorrhaging,  consistent with painful bladder  syndrome.  The patient underwent a second repeat hydraulic  overdistention of her bladder for an additional 3 minutes.  Her bladder  was then drained and Marcaine was instilled to help with postop  analgesia.  She was brought to the recovery room in stable condition.      Valetta Fuller, M.D.  Electronically Signed     DSG/MEDQ  D:  06/22/2008  T:  06/22/2008  Job:  [id number]   cc:   Evie Lacks, MD  Fax: [phone]

## 2011-06-22 ENCOUNTER — Inpatient Hospital Stay (INDEPENDENT_AMBULATORY_CARE_PROVIDER_SITE_OTHER)
Admission: RE | Admit: 2011-06-22 | Discharge: 2011-06-22 | Disposition: A | Discharge status: Home / Self Care | Payer: BC Managed Care – PPO | Source: Ambulatory Visit | Attending: Family Medicine | Admitting: Family Medicine

## 2011-06-22 DIAGNOSIS — N3010 Interstitial cystitis (chronic) without hematuria: Secondary | ICD-10-CM

## 2011-06-22 DIAGNOSIS — I10 Essential (primary) hypertension: Secondary | ICD-10-CM

## 2011-06-22 DIAGNOSIS — T50995A Adverse effect of other drugs, medicaments and biological substances, initial encounter: Secondary | ICD-10-CM

## 2011-06-22 LAB — POCT URINALYSIS DIP (DEVICE)
Glucose, UA: NEGATIVE mg/dL
Nitrite: NEGATIVE
Protein, ur: NEGATIVE mg/dL
Urobilinogen, UA: 0.2 mg/dL (ref 0.0–1.0)

## 2011-06-22 LAB — POCT PREGNANCY, URINE: Preg Test, Ur: NEGATIVE

## 2011-08-04 LAB — URINALYSIS, ROUTINE W REFLEX MICROSCOPIC
Bilirubin Urine: NEGATIVE
Ketones, ur: 15 — AB
Nitrite: NEGATIVE
Protein, ur: NEGATIVE
pH: 5.5

## 2011-08-04 LAB — COMPREHENSIVE METABOLIC PANEL
CO2: 21
Calcium: 8.9
Creatinine, Ser: 0.75
GFR calc non Af Amer: >60
Glucose, Bld: 105 — ABNORMAL HIGH
Sodium: 140
Total Protein: 7.4

## 2011-08-04 LAB — DIFFERENTIAL
Lymphocytes Relative: 5 — ABNORMAL LOW
Lymphs Abs: 0.4 — ABNORMAL LOW
Monocytes Relative: 5
Neutro Abs: 6.1
Neutrophils Relative %: 90 — ABNORMAL HIGH

## 2011-08-04 LAB — CBC
Hemoglobin: 14.2
MCHC: 35.2
MCV: 89.2
RBC: 4.53
RDW: 13.3

## 2011-08-04 LAB — LIPASE, BLOOD: Lipase: 22

## 2011-08-08 LAB — MONONUCLEOSIS SCREEN: Mono Screen: NEGATIVE

## 2011-08-08 LAB — POCT PREGNANCY, URINE: Preg Test, Ur: NEGATIVE

## 2011-08-08 LAB — RAPID STREP SCREEN (MED CTR MEBANE ONLY): Streptococcus, Group A Screen (Direct): NEGATIVE

## 2011-08-08 LAB — POCT HEMOGLOBIN-HEMACUE: Hemoglobin: 13.4

## 2011-08-12 LAB — POCT I-STAT, CHEM 8
BUN: 13 mg/dL (ref 6–23)
Chloride: 109 mEq/L (ref 96–112)
Glucose, Bld: 74 mg/dL (ref 70–99)
HCT: 44 % (ref 36.0–46.0)
Potassium: 3.9 mEq/L (ref 3.5–5.1)

## 2011-08-12 LAB — POCT PREGNANCY, URINE: Preg Test, Ur: NEGATIVE

## 2011-08-12 LAB — POCT URINALYSIS DIP (DEVICE)
Nitrite: NEGATIVE
Protein, ur: 100 mg/dL — AB
Urobilinogen, UA: 1 mg/dL (ref 0.0–1.0)
pH: 6.5 (ref 5.0–8.0)

## 2011-08-12 LAB — URINE CULTURE

## 2011-08-14 DIAGNOSIS — K589 Irritable bowel syndrome without diarrhea: Secondary | ICD-10-CM | POA: Insufficient documentation

## 2011-08-14 DIAGNOSIS — M797 Fibromyalgia: Secondary | ICD-10-CM | POA: Insufficient documentation

## 2011-08-15 LAB — URINE CULTURE: Colony Count: 8000

## 2011-08-15 LAB — POCT URINALYSIS DIP (DEVICE)
Bilirubin Urine: NEGATIVE
Ketones, ur: NEGATIVE mg/dL
Specific Gravity, Urine: >=1.03 (ref 1.005–1.030)
pH: 6.5 (ref 5.0–8.0)

## 2011-11-21 ENCOUNTER — Ambulatory Visit (INDEPENDENT_AMBULATORY_CARE_PROVIDER_SITE_OTHER): Payer: BC Managed Care – PPO

## 2011-11-21 DIAGNOSIS — J029 Acute pharyngitis, unspecified: Secondary | ICD-10-CM

## 2011-11-21 DIAGNOSIS — Z3049 Encounter for surveillance of other contraceptives: Secondary | ICD-10-CM

## 2012-02-20 ENCOUNTER — Ambulatory Visit (INDEPENDENT_AMBULATORY_CARE_PROVIDER_SITE_OTHER): Payer: BC Managed Care – PPO | Admitting: Physician Assistant

## 2012-02-20 VITALS — BP 99/65 | HR 73 | Temp 98.3°F | Resp 16

## 2012-02-20 DIAGNOSIS — Z309 Encounter for contraceptive management, unspecified: Secondary | ICD-10-CM

## 2012-02-20 MED ORDER — MEDROXYPROGESTERONE ACETATE 150 MG/ML IM SUSP
150.0000 mg | Freq: Once | INTRAMUSCULAR | Status: AC
  Administered 2012-02-20: 150 mg via INTRAMUSCULAR

## 2012-02-20 NOTE — Progress Notes (Signed)
Pt here for Depo Provera injection.  Last was 11/21/11.  Pt on time today and can receive her next injection.  She will be advised of the next injection date in 3 months.

## 2012-08-11 ENCOUNTER — Other Ambulatory Visit: Payer: Self-pay | Admitting: Urology

## 2012-08-11 MED ORDER — OXYCHLOROSENE SODIUM POWD: Freq: Once | Status: AC

## 2012-08-11 MED ORDER — BUPIVACAINE HCL 0.25 % IJ SOLN: 30.0000 mL | Freq: Once | INTRAMUSCULAR | Status: AC

## 2012-08-12 ENCOUNTER — Encounter (HOSPITAL_BASED_OUTPATIENT_CLINIC_OR_DEPARTMENT_OTHER): Payer: Self-pay | Admitting: *Deleted

## 2012-08-12 NOTE — Progress Notes (Signed)
NPO AFTER MN. ARRIVES AT 1030. NEEDS HG AND URINE PREG. 

## 2012-08-16 ENCOUNTER — Encounter (HOSPITAL_BASED_OUTPATIENT_CLINIC_OR_DEPARTMENT_OTHER)
Admission: RE | Disposition: A | Discharge status: Home / Self Care | Payer: Self-pay | Source: Ambulatory Visit | Attending: Urology

## 2012-08-16 ENCOUNTER — Encounter (HOSPITAL_BASED_OUTPATIENT_CLINIC_OR_DEPARTMENT_OTHER): Payer: Self-pay | Admitting: Anesthesiology

## 2012-08-16 ENCOUNTER — Ambulatory Visit (HOSPITAL_BASED_OUTPATIENT_CLINIC_OR_DEPARTMENT_OTHER): Payer: BC Managed Care – PPO | Admitting: Anesthesiology

## 2012-08-16 ENCOUNTER — Ambulatory Visit (HOSPITAL_BASED_OUTPATIENT_CLINIC_OR_DEPARTMENT_OTHER)
Admission: RE | Admit: 2012-08-16 | Discharge: 2012-08-16 | Disposition: A | Discharge status: Home / Self Care | Payer: BC Managed Care – PPO | Source: Ambulatory Visit | Attending: Urology | Admitting: Urology

## 2012-08-16 ENCOUNTER — Encounter (HOSPITAL_BASED_OUTPATIENT_CLINIC_OR_DEPARTMENT_OTHER): Payer: Self-pay

## 2012-08-16 DIAGNOSIS — N3010 Interstitial cystitis (chronic) without hematuria: Secondary | ICD-10-CM | POA: Insufficient documentation

## 2012-08-16 HISTORY — DX: Nausea with vomiting, unspecified: R11.2

## 2012-08-16 HISTORY — DX: Gastro-esophageal reflux disease without esophagitis: K21.9

## 2012-08-16 HISTORY — DX: Nocturia: R35.1

## 2012-08-16 HISTORY — DX: Frequency of micturition: R35.0

## 2012-08-16 HISTORY — DX: Other specified postprocedural states: Z98.890

## 2012-08-16 HISTORY — DX: Arthralgia of temporomandibular joint, unspecified side: M26.629

## 2012-08-16 HISTORY — PX: CYSTO WITH HYDRODISTENSION: SHX5453

## 2012-08-16 HISTORY — DX: Urgency of urination: R39.15

## 2012-08-16 HISTORY — DX: Interstitial cystitis (chronic) without hematuria: N30.10

## 2012-08-16 SURGERY — CYSTOSCOPY, WITH BLADDER HYDRODISTENSION
Anesthesia: General | Site: Bladder | Wound class: Clean Contaminated

## 2012-08-16 MED ORDER — DEXAMETHASONE SODIUM PHOSPHATE 4 MG/ML IJ SOLN
INTRAMUSCULAR | Status: DC | PRN
  Administered 2012-08-16: 10 mg via INTRAVENOUS

## 2012-08-16 MED ORDER — PROPOFOL 10 MG/ML IV BOLUS
INTRAVENOUS | Status: DC | PRN
  Administered 2012-08-16: 200 mg via INTRAVENOUS

## 2012-08-16 MED ORDER — STERILE WATER FOR IRRIGATION IR SOLN
Status: DC | PRN
  Administered 2012-08-16: 3000 mL

## 2012-08-16 MED ORDER — PHENAZOPYRIDINE HCL 200 MG PO TABS: 200.0000 mg | ORAL_TABLET | Freq: Three times a day (TID) | ORAL | Status: DC | PRN

## 2012-08-16 MED ORDER — FENTANYL CITRATE 0.05 MG/ML IJ SOLN: 25.0000 ug | INTRAMUSCULAR | Status: DC | PRN

## 2012-08-16 MED ORDER — CIPROFLOXACIN IN D5W 400 MG/200ML IV SOLN
400.0000 mg | INTRAVENOUS | Status: AC
  Administered 2012-08-16: 400 mg via INTRAVENOUS

## 2012-08-16 MED ORDER — OXYCHLOROSENE SODIUM POWD
Status: DC | PRN
  Administered 2012-08-16: 1

## 2012-08-16 MED ORDER — LACTATED RINGERS IV SOLN: INTRAVENOUS | Status: DC

## 2012-08-16 MED ORDER — MIDAZOLAM HCL 5 MG/5ML IJ SOLN
INTRAMUSCULAR | Status: DC | PRN
  Administered 2012-08-16: 1 mg via INTRAVENOUS

## 2012-08-16 MED ORDER — ONDANSETRON HCL 4 MG/2ML IJ SOLN
INTRAMUSCULAR | Status: DC | PRN
  Administered 2012-08-16: 4 mg via INTRAVENOUS

## 2012-08-16 MED ORDER — LIDOCAINE HCL (CARDIAC) 20 MG/ML IV SOLN
INTRAVENOUS | Status: DC | PRN
  Administered 2012-08-16: 75 mg via INTRAVENOUS

## 2012-08-16 MED ORDER — LACTATED RINGERS IV SOLN
INTRAVENOUS | Status: DC
  Administered 2012-08-16: 12:00:00 via INTRAVENOUS
  Administered 2012-08-16: 100 mL/h via INTRAVENOUS

## 2012-08-16 MED ORDER — PROMETHAZINE HCL 25 MG/ML IJ SOLN: 6.2500 mg | INTRAMUSCULAR | Status: DC | PRN

## 2012-08-16 MED ORDER — LIDOCAINE HCL 2 % EX GEL
CUTANEOUS | Status: DC | PRN
  Administered 2012-08-16: 1

## 2012-08-16 MED ORDER — FENTANYL CITRATE 0.05 MG/ML IJ SOLN
INTRAMUSCULAR | Status: DC | PRN
  Administered 2012-08-16 (×2): 25 ug via INTRAVENOUS
  Administered 2012-08-16 (×2): 50 ug via INTRAVENOUS
  Administered 2012-08-16: 25 ug via INTRAVENOUS

## 2012-08-16 MED ORDER — BUPIVACAINE HCL (PF) 0.25 % IJ SOLN
INTRAMUSCULAR | Status: DC | PRN
  Administered 2012-08-16: 20 mL

## 2012-08-16 MED ORDER — HYDROCODONE-ACETAMINOPHEN 5-325 MG PO TABS: 1.0000 | ORAL_TABLET | Freq: Four times a day (QID) | ORAL | Status: DC | PRN

## 2012-08-16 SURGICAL SUPPLY — 19 items
BAG DRAIN URO-CYSTO SKYTR STRL (DRAIN) ×2 IMPLANT
CANISTER SUCT LVC 12 LTR MEDI- (MISCELLANEOUS) ×2 IMPLANT
CATH ROBINSON RED A/P 14FR (CATHETERS) ×2 IMPLANT
CATH ROBINSON RED A/P 16FR (CATHETERS) IMPLANT
CATH ROBINSON RED A/P 18FR (CATHETERS) ×2 IMPLANT
CLOTH BEACON ORANGE TIMEOUT ST (SAFETY) ×2 IMPLANT
DRAPE CAMERA CLOSED 9X96 (DRAPES) ×2 IMPLANT
ELECT REM PT RETURN 9FT ADLT (ELECTROSURGICAL) ×2
ELECTRODE REM PT RTRN 9FT ADLT (ELECTROSURGICAL) ×1 IMPLANT
GLOVE BIO SURGEON STRL SZ7.5 (GLOVE) ×2 IMPLANT
GLOVE INDICATOR 6.5 STRL GRN (GLOVE) ×4 IMPLANT
GOWN PREVENTION PLUS LG XLONG (DISPOSABLE) ×2 IMPLANT
GOWN STRL REIN XL XLG (GOWN DISPOSABLE) ×2 IMPLANT
NEEDLE HYPO 22GX1.5 SAFETY (NEEDLE) IMPLANT
NS IRRIG 500ML POUR BTL (IV SOLUTION) ×2 IMPLANT
PACK CYSTOSCOPY (CUSTOM PROCEDURE TRAY) ×2 IMPLANT
SYR BULB IRRIGATION 50ML (SYRINGE) ×2 IMPLANT
WATER STERILE IRR 3000ML UROMA (IV SOLUTION) ×2 IMPLANT
WATER STERILE IRR 500ML POUR (IV SOLUTION) ×2 IMPLANT

## 2012-08-16 NOTE — Interval H&P Note (Signed)
History and Physical Interval Note:  08/16/2012 10:57 AM  Karen Meyer  has presented today for surgery, with the diagnosis of INTERSTITIAL CYSTITIS  The various methods of treatment have been discussed with the patient and family. After consideration of risks, benefits and other options for treatment, the patient has consented to  Procedure(s) (LRB) with comments: CYSTOSCOPY/HYDRODISTENSION (N/A) - 30 MIN With instillation of Chloropactin and  Marcaine [phone] wk [phone] BCBS as a surgical intervention .  The patient's history has been reviewed, patient examined, no change in status, stable for surgery.  I have reviewed the patient's chart and labs.  Questions were answered to the patient's satisfaction.     [name]

## 2012-08-16 NOTE — Anesthesia Postprocedure Evaluation (Signed)
  Anesthesia Post-op Note  Patient: Karen Meyer  Procedure(s) Performed: Procedure(s) (LRB): CYSTOSCOPY/HYDRODISTENSION (N/A)  Patient Location: PACU  Anesthesia Type: General  Level of Consciousness: awake and alert   Airway and Oxygen Therapy: Patient Spontanous Breathing  Post-op Pain: mild  Post-op Assessment: Post-op Vital signs reviewed, Patient's Cardiovascular Status Stable, Respiratory Function Stable, Patent Airway and No signs of Nausea or vomiting  Post-op Vital Signs: stable  Complications: No apparent anesthesia complications

## 2012-08-16 NOTE — Op Note (Signed)
Preoperative diagnosis:  Interstitial Cystitis Postoperative diagnosis: Same Procedure:  Cystoscopy and hydraulic overdistention of the bladder with instillation of Clorpactin and Marcaine Surgeon Valetta Fuller, MD Anesthesia: General  Indication: Karen Meyer has a long-standing history of interstitial cystitis.She  has had a recent flare and requested repeat hydraulic overdistention of the bladder.  Technique and findings:   The patient was brought to the operating room where [facility] successful induction of general anesthesia.The patient was placed in lithotomy position and prepped and draped in usual manner. Appropriate surgical timeout was performed.The patient received perioperative antibiotics. Initial cystoscopy revealed no abnormalities of the bladder. The patient underwent hydraulic overdistention of the bladder to 100 cm of water pressure for 5 minutes. Capacity was felt to be 550 cc. The patient had diffuse 4+ glomerular bleeding noted consistent with the diagnosis of interstitial cystitis. The patient had instillation of Clorpactin in a standard manner with gravity administration. Marcaine was then instilled in the bladder.The patient was brought to recovery room in stable condition having no obvious complications or problems.   Valetta Fuller, MD 08/16/2012, 12:01 PM

## 2012-08-16 NOTE — Anesthesia Preprocedure Evaluation (Addendum)
Anesthesia Evaluation  Patient identified by MRN, date of birth, ID band Patient awake    Reviewed: Allergy & Precautions, H&P , NPO status , Patient's Chart, lab work & pertinent test results  History of Anesthesia Complications (+) PONV  Airway Mallampati: II TM Distance: >3 FB Neck ROM: Full    Dental  (+) Teeth Intact and Dental Advisory Given   Pulmonary neg pulmonary ROS,  breath sounds clear to auscultation  Pulmonary exam normal       Cardiovascular negative cardio ROS  Rhythm:Regular Rate:Normal     Neuro/Psych  Neuromuscular disease negative psych ROS   GI/Hepatic Neg liver ROS, GERD-  ,  Endo/Other  negative endocrine ROS  Renal/GU negative Renal ROSIC  negative genitourinary   Musculoskeletal negative musculoskeletal ROS (+)   Abdominal   Peds negative pediatric ROS (+)  Hematology negative hematology ROS (+)   Anesthesia Other Findings   Reproductive/Obstetrics negative OB ROS                         Anesthesia Physical Anesthesia Plan  ASA: I  Anesthesia Plan: General   Post-op Pain Management:    Induction: Intravenous  Airway Management Planned: LMA  Additional Equipment:   Intra-op Plan:   Post-operative Plan: Extubation in OR  Informed Consent: I have reviewed the patients History and Physical, chart, labs and discussed the procedure including the risks, benefits and alternatives for the proposed anesthesia with the patient or authorized representative who has indicated his/her understanding and acceptance.   Dental advisory given  Plan Discussed with: CRNA  Anesthesia Plan Comments:         Anesthesia Quick Evaluation

## 2012-08-16 NOTE — Transfer of Care (Signed)
Immediate Anesthesia Transfer of Care Note  Patient: Karen Meyer  Procedure(s) Performed: Procedure(s) (LRB): CYSTOSCOPY/HYDRODISTENSION (N/A)  Patient Location: Patient transported to PACU with oxygen via face mask at 4 Liters / Min  Anesthesia Type: General  Level of Consciousness: awake and alert   Airway & Oxygen Therapy: Patient Spontanous Breathing and Patient connected to face mask oxygen  Post-op Assessment: Report given to PACU RN and Post -op Vital signs reviewed and stable  Post vital signs: Reviewed and stable  Dentition: Teeth and oropharynx remain in pre-op condition  Complications: No apparent anesthesia complications

## 2012-08-16 NOTE — H&P (Signed)
Urology Admission H&P  Chief Complaint: Pelvic/bladder pain with history of interstitial cystitis.  History of Present Illness: [name] is 26 years of age and has had a long-standing history of bladder overactivity with bladder and pelvic discomfort consistent with painful bladder syndrome/interstitial cystitis. The patient has undergone hydraulic overdistention of the bladder in the past with excellent improvement in her symptom complex. She was interested in having this procedure done again after developing a flare of her interstitial cystitis.  Past Medical History  Diagnosis Date  . Mild acid reflux   . TMJ syndrome LEFT SIDE  . PONV (postoperative nausea and vomiting)   . Frequency of urination   . Urgency of urination   . Nocturia   . Interstitial cystitis    Past Surgical History  Procedure Date  . Cysto/ hod/ instillation clorpactin 03-10-2011;  MAR 2011;  2009    FOR I.C.  . Cesarean section 2007    Home Medications:  No prescriptions prior to admission   Allergies:  Allergies  Allergen Reactions  . Darvocet (Propoxyphene-Acetaminophen) Hives    History reviewed. No pertinent family history. Social History:  reports that she has never smoked. She has never used smokeless tobacco. She reports that she drinks alcohol. She reports that she does not use illicit drugs.  Review of Systems  Gastrointestinal: Positive for heartburn.  Genitourinary: Positive for urgency and frequency.  Skin: Positive for itching and rash.  Endo/Heme/Allergies: Bruises/bleeds easily.    Physical Exam:  Vital signs in last 24 hours:   Physical Exam  Constitutional: She is oriented to person, place, and time. She appears well-developed and well-nourished. No distress.  HENT:  Head: Normocephalic.  Cardiovascular: Normal rate and regular rhythm.   Respiratory: Effort normal.  GI: Soft. She exhibits no mass. There is no tenderness.  Musculoskeletal: She exhibits no edema and no  tenderness.  Neurological: She is alert and oriented to person, place, and time.  Skin: Skin is warm.  Psychiatric: She has a normal mood and affect.    Laboratory Data:  No results found for this or any previous visit (from the past 24 hour(s)). No results found for this or any previous visit (from the past 240 hour(s)). Creatinine: No results found for this basename: CREATININE:7 in the last 168 hours Baseline Creatinine:   Impression/Assessment:  Interstitial cystitis flare  Plan:  Cystoscopy with hydraulic overdistention of the bladder and possible instillation of Clorpactin and Marcaine.  Jassmin Kemmerer S 08/16/2012, 8:02 AM

## 2012-08-16 NOTE — Anesthesia Procedure Notes (Signed)
Procedure Name: LMA Insertion Date/Time: 08/16/2012 12:01 PM Performed by: Fran Lowes Pre-anesthesia Checklist: Patient identified, Emergency Drugs available, Suction available and Patient being monitored Patient Re-evaluated:Patient Re-evaluated prior to inductionOxygen Delivery Method: Circle System Utilized Preoxygenation: Pre-oxygenation with 100% oxygen Intubation Type: IV induction Ventilation: Mask ventilation without difficulty LMA: LMA inserted LMA Size: 4.0 Number of attempts: 1 Airway Equipment and Method: bite block Placement Confirmation: positive ETCO2 Tube secured with: Tape Dental Injury: Teeth and Oropharynx as per pre-operative assessment

## 2012-08-17 ENCOUNTER — Encounter (HOSPITAL_BASED_OUTPATIENT_CLINIC_OR_DEPARTMENT_OTHER): Payer: Self-pay | Admitting: Urology

## 2012-08-18 LAB — POCT HEMOGLOBIN-HEMACUE: Hemoglobin: 13.5 g/dL (ref 12.0–15.0)

## 2012-08-19 ENCOUNTER — Ambulatory Visit (INDEPENDENT_AMBULATORY_CARE_PROVIDER_SITE_OTHER): Payer: BC Managed Care – PPO | Admitting: Physician Assistant

## 2012-08-19 VITALS — BP 104/72 | HR 64 | Temp 97.9°F | Resp 16 | Ht 63.75 in | Wt 160.8 lb

## 2012-08-19 DIAGNOSIS — R350 Frequency of micturition: Secondary | ICD-10-CM

## 2012-08-19 DIAGNOSIS — Z23 Encounter for immunization: Secondary | ICD-10-CM

## 2012-08-19 LAB — POCT UA - MICROSCOPIC ONLY
Casts, Ur, LPF, POC: NEGATIVE
Crystals, Ur, HPF, POC: NEGATIVE
Mucus, UA: NEGATIVE
Yeast, UA: NEGATIVE

## 2012-08-19 LAB — POCT URINALYSIS DIPSTICK
Bilirubin, UA: NEGATIVE
Ketones, UA: NEGATIVE
Nitrite, UA: NEGATIVE
Protein, UA: NEGATIVE
pH, UA: 6

## 2012-08-19 NOTE — Progress Notes (Signed)
Subjective:    Patient ID: Karen Meyer, female    DOB: [birth year], 26 y.o.   MRN: [id number]  HPI This 26 y.o. female presents for evaluation of urinary frequency. Symptoms began after the cystoscopy/hydrodistension procedure she had 08/16/2012 for interstitial cystitis.  The last time she had the procedure she developed a UTI, but thought the symptoms were related to the procedure and waited weeks to be evaluated.  She denies any burning or urgency beyond her baseline symptoms, but the frequency of her urge to urinate has increased.  She denies hematuria, vaginal discharge.  She is not currently sexually active-too uncomfortable-but wants to discuss her options.  She previously used DepoProvera, but has not kept up with the injections, wanting to change to something else.  Also previously used COCs.  Doesn't want anything with hormones at this point, but doesn't think her husband will agree to condom use.  She is very clear that she doesn't want more children, stating that she just couldn't manage more than the one she has.  Review of Systems As above.  Nor fever, chills. No chest pain, SOB, HA, dizziness, vision change, N/V, diarrhea, constipation, myalgias, arthralgias or rash.    Past Medical History  Diagnosis Date  . Mild acid reflux   . TMJ syndrome LEFT SIDE  . PONV (postoperative nausea and vomiting)   . Frequency of urination   . Urgency of urination   . Nocturia   . Interstitial cystitis     Past Surgical History  Procedure Date  . Cysto/ hod/ instillation clorpactin 03-10-2011;  MAR 2011;  2009    FOR I.C.  . Cesarean section 2007  . Cysto with hydrodistension 08/16/2012    Procedure: CYSTOSCOPY/HYDRODISTENSION;  Surgeon: Valetta Fuller, MD;  Location: [facility];  Service: Urology;  Laterality: N/A;    Prior to Admission medications   Medication Sig Start Date End Date Taking? Authorizing Provider  darifenacin (ENABLEX) 15 MG 24 hr tablet Take 15  mg by mouth daily.   Yes Historical Provider, MD  hydrOXYzine (ATARAX/VISTARIL) 25 MG tablet Take 25 mg by mouth at bedtime.   Yes Historical Provider, MD  phenazopyridine (PYRIDIUM) 200 MG tablet Take 1 tablet (200 mg total) by mouth 3 (three) times daily as needed for pain. 08/16/12  Yes Valetta Fuller, MD    Allergies  Allergen Reactions  . Darvocet (Propoxyphene-Acetaminophen) Hives    History   Social History  . Marital Status: Married    Spouse Name: Daphine Deutscher    Number of Children: 1  . Years of Education: 16   Occupational History  . Higher education careers adviser   Social History Main Topics  . Smoking status: Never Smoker   . Smokeless tobacco: Never Used  . Alcohol Use: Yes     RARE  . Drug Use: No  . Sexually Active: Not Currently -- Female partner(s)   Other Topics Concern  . Not on file   Social History Narrative   Definitely does not want to become pregnant again.    Family History  Problem Relation Age of Onset  . Diabetes Mother   . Cancer Mother 64    2003; BREAST  . Diverticulosis Mother   . Hyperlipidemia Father        Objective:   Physical Exam  Blood pressure 104/72, pulse 64, temperature 97.9 F (36.6 C), temperature source Oral, resp. rate 16, height 5' 3.75" (1.619 m), weight 160 lb 12.8 oz (72.938  kg), SpO2 100.00%. Body mass index is 27.82 kg/(m^2). Well-developed, well nourished female who is awake, alert and oriented, in NAD. HEENT: [place]/AT, sclera and conjunctiva are clear.  Neck: supple, non-tender, no lymphadenopathy, thyromegaly. Heart: RRR, no murmur Lungs: normal effort, CTA Abdomen: normo-active bowel sounds, supple, non-tender, no mass or organomegaly. Back:  Non-tender.  No CVA tenderness. Extremities: no cyanosis, clubbing or edema. Skin: warm and dry without rash. Psychologic: good mood and appropriate affect, normal speech and behavior.  Results for orders placed in visit on 08/19/12  POCT URINALYSIS DIPSTICK       Component Value Range   Color, UA yellow     Clarity, UA clear     Glucose, UA neg     Bilirubin, UA neg     Ketones, UA neg     Spec Grav, UA 1.020     Blood, UA trace-intact     pH, UA 6.0     Protein, UA neg     Urobilinogen, UA 0.2     Nitrite, UA neg     Leukocytes, UA Trace    POCT UA - MICROSCOPIC ONLY      Component Value Range   WBC, Ur, HPF, POC 0-3     RBC, urine, microscopic 0-4     Bacteria, U Microscopic trace     Mucus, UA neg     Epithelial cells, urine per micros 0-3     Crystals, Ur, HPF, POC neg     Casts, Ur, LPF, POC neg     Yeast, UA neg          Assessment & Plan:   1. Frequent urination s/p cystoscopy and hydrodilation POCT urinalysis dipstick, POCT UA - Microscopic Only, Urine culture  2. Need for influenza vaccination  Flu vaccine greater than or equal to 3yo preservative free IM

## 2012-08-19 NOTE — Patient Instructions (Addendum)
Talk with your primary care provider about contraception options.  The Paragard IUD may be a good choice for you since it is long-acting (it's good for 10 years) and contains no hormones.  Until then, use condoms or abstain for sex if you don't want to become pregnant.  I will contact you with your lab results as soon as they are available.  If you have not heard from me in 2 weeks, please contact me.

## 2012-08-25 ENCOUNTER — Telehealth: Payer: Self-pay

## 2012-09-23 ENCOUNTER — Ambulatory Visit (INDEPENDENT_AMBULATORY_CARE_PROVIDER_SITE_OTHER): Payer: BC Managed Care – PPO | Admitting: Family Medicine

## 2012-09-23 VITALS — BP 105/66 | HR 79 | Temp 98.3°F | Resp 16 | Ht 64.0 in | Wt 160.0 lb

## 2012-09-23 DIAGNOSIS — T148 Other injury of unspecified body region: Secondary | ICD-10-CM

## 2012-09-23 DIAGNOSIS — L503 Dermatographic urticaria: Secondary | ICD-10-CM

## 2012-09-23 DIAGNOSIS — W57XXXA Bitten or stung by nonvenomous insect and other nonvenomous arthropods, initial encounter: Secondary | ICD-10-CM

## 2012-09-23 MED ORDER — TRIAMCINOLONE ACETONIDE 0.1 % EX CREA: TOPICAL_CREAM | Freq: Two times a day (BID) | CUTANEOUS | Status: DC

## 2012-09-23 NOTE — Patient Instructions (Addendum)
1. Insect bite  triamcinolone cream (KENALOG) 0.1 %  2. Dermatographia         CONTINUE ZYRTEC 10MG  THREE TIMES DAILY. APPLY TRIAMCINOLONE CREAM TWICE DAILY.   APPLY BENADRYL CREAM TO AREA AS NEEDED FOR ITCHING.

## 2012-09-23 NOTE — Progress Notes (Signed)
[address]   [place], [place]  [address]   [phone]  Subjective:    Patient ID: Karen Meyer, female    DOB: 1986/03/12, 26 y.o.   MRN: [id number]  HPI This 26 y.o. female presents for evaluation of buttocks rash for five weeks.  Will not go away.  Not enlarging; initially thought was a mosquito bite but has now turned purple.  No medications; applied cortizone cream; taking Zyrtec daily.  No pustule; no blistering/vesicles.  No similar symptoms.    PCP:  [facility] [place]    Review of Systems  Constitutional: Negative for fever, chills, diaphoresis and fatigue.  Skin: Positive for color change and rash. Negative for pallor.  Neurological: Negative for weakness and numbness.        Past Medical History  Diagnosis Date  . Mild acid reflux   . TMJ syndrome LEFT SIDE  . PONV (postoperative nausea and vomiting)   . Frequency of urination   . Urgency of urination   . Nocturia   . Interstitial cystitis     Past Surgical History  Procedure Date  . Cysto/ hod/ instillation clorpactin 03-10-2011;  MAR 2011;  2009    FOR I.C.  . Cesarean section 2007  . Cysto with hydrodistension 08/16/2012    Procedure: CYSTOSCOPY/HYDRODISTENSION;  Surgeon: Valetta Fuller, MD;  Location: [facility];  Service: Urology;  Laterality: N/A;    Prior to Admission medications   Medication Sig Start Date End Date Taking? Authorizing Provider  cetirizine (ZYRTEC) 10 MG tablet Take 10 mg by mouth daily.   Yes Historical Provider, MD  darifenacin (ENABLEX) 15 MG 24 hr tablet Take 15 mg by mouth daily.   Yes Historical Provider, MD  triamcinolone cream (KENALOG) 0.1 % Apply topically 2 (two) times daily. 09/23/12   Ethelda Chick, MD    Allergies  Allergen Reactions  . Darvocet (Propoxyphene-Acetaminophen) Hives    History   Social History  . Marital Status: Married    Spouse Name: Daphine Deutscher    Number of Children: 1  . Years of Education: 16   Occupational History    . Higher education careers adviser   Social History Main Topics  . Smoking status: Never Smoker   . Smokeless tobacco: Never Used  . Alcohol Use: Yes     Comment: RARE  . Drug Use: No  . Sexually Active: Not Currently -- Female partner(s)   Other Topics Concern  . Not on file   Social History Narrative   Definitely does not want to become pregnant again.    Family History  Problem Relation Age of Onset  . Diabetes Mother   . Cancer Mother 68    2003; BREAST  . Diverticulosis Mother   . Hyperlipidemia Father     Objective:   Physical Exam  Nursing note and vitals reviewed. Constitutional: She is oriented to person, place, and time. She appears well-developed and well-nourished. No distress.  Neurological: She is alert and oriented to person, place, and time. No cranial nerve deficit. She exhibits normal muscle tone. Coordination normal.  Skin: She is not diaphoretic.       Buttocks region with 1cm x 1.5cm area of induration, mild swelling without fluctuants, erythema, scaling.  No associated pustules or vesicles.    Psychiatric: She has a normal mood and affect. Her behavior is normal.       Assessment & Plan:   1. Insect bite  triamcinolone cream (KENALOG)  0.1 %  2. Dermatographia       1. Insect Bite:  New.  With persistent localized swelling. Recommend Zyrtec 10mg  daily.  Rx for Kenaolog cream to apply to area bid to tid for next three weeks. Avoid manipulation of area.  Advised not to squeeze or pick at area.  No signs of secondary infection.  Meds ordered this encounter  Medications  . cetirizine (ZYRTEC) 10 MG tablet    Sig: Take 10 mg by mouth daily.  Marland Kitchen triamcinolone cream (KENALOG) 0.1 %    Sig: Apply topically 2 (two) times daily.    Dispense:  30 g    Refill:  0

## 2012-12-20 NOTE — Progress Notes (Signed)
Reviewed and agree.

## 2013-06-27 ENCOUNTER — Encounter: Payer: BC Managed Care – PPO | Admitting: Genetic Counselor

## 2013-06-27 ENCOUNTER — Other Ambulatory Visit: Payer: BC Managed Care – PPO | Admitting: Lab

## 2013-09-21 ENCOUNTER — Inpatient Hospital Stay (HOSPITAL_COMMUNITY)
Admission: AD | Admit: 2013-09-21 | Discharge: 2013-09-21 | Disposition: A | Discharge status: Home / Self Care | Payer: Managed Care, Other (non HMO) | Source: Ambulatory Visit | Attending: Obstetrics and Gynecology | Admitting: Obstetrics and Gynecology

## 2013-09-21 ENCOUNTER — Encounter (HOSPITAL_COMMUNITY): Payer: Self-pay | Admitting: *Deleted

## 2013-09-21 DIAGNOSIS — Z3202 Encounter for pregnancy test, result negative: Secondary | ICD-10-CM | POA: Insufficient documentation

## 2013-09-21 DIAGNOSIS — N912 Amenorrhea, unspecified: Secondary | ICD-10-CM | POA: Insufficient documentation

## 2013-09-21 DIAGNOSIS — R112 Nausea with vomiting, unspecified: Secondary | ICD-10-CM | POA: Insufficient documentation

## 2013-09-21 HISTORY — DX: Fibromyalgia: M79.7

## 2013-09-21 LAB — CBC WITH DIFFERENTIAL/PLATELET
Basophils Absolute: 0 10*3/uL (ref 0.0–0.1)
HCT: 34 % — ABNORMAL LOW (ref 36.0–46.0)
Hemoglobin: 12.1 g/dL (ref 12.0–15.0)
Lymphocytes Relative: 44 % (ref 12–46)
Lymphs Abs: 1.8 10*3/uL (ref 0.7–4.0)
Monocytes Absolute: 0.4 10*3/uL (ref 0.1–1.0)
Monocytes Relative: 11 % (ref 3–12)
Neutro Abs: 1.7 10*3/uL (ref 1.7–7.7)
RBC: 3.88 MIL/uL (ref 3.87–5.11)
WBC: 4 10*3/uL (ref 4.0–10.5)

## 2013-09-21 LAB — URINALYSIS, ROUTINE W REFLEX MICROSCOPIC
Bilirubin Urine: NEGATIVE
Glucose, UA: 100 mg/dL — AB
Ketones, ur: NEGATIVE mg/dL
Protein, ur: NEGATIVE mg/dL
pH: 7 (ref 5.0–8.0)

## 2013-09-21 LAB — PROGESTERONE: Progesterone: 0.8 ng/mL

## 2013-09-21 NOTE — MAU Note (Signed)
Pt LMP 08/20/2013, taking clomid, neg UPT at home. Having bodyaches, nausea, vomiting, breast tenderness. Denies vomiting today.

## 2013-09-21 NOTE — MAU Note (Signed)
V. Latham CNM at the bedside. 

## 2013-09-21 NOTE — MAU Provider Note (Signed)
History   27 yo G1P1 presented unannounced c/o late cycle, nausea, vomiting x 1, and body aches.  Denies fever, dysuria, vaginal bleeding, viral exposures, or any other sx.  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  Expected period on 11/10, with small amount brown spotting before that, but no full cycle yet.  Denies abdominal or pelvic pain, reports normal appetite prior to onset of current sx.  Normal bowel and bladder function.  Does report strong family hx of Type 2 diabetes.  Last ate around 9pm.  Patient Active Problem List   Diagnosis Date Noted  . LEUKOPENIA, CHRONIC 08/08/2010  . APHTHOUS STOMATITIS 08/08/2010  . MYALGIA 08/08/2010  . CHANGE IN BOWELS 08/08/2010  . 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 TMJ SYNDROME 07/31/2010  . GERD 07/31/2010  . UTI'S, HX OF 07/31/2010  . CYSTITIS, HX OF 07/31/2010  . THRUSH 07/30/2010  Reports fibromyalgia has been in quiescent state recently.    Chief Complaint  Patient presents with  . Possible Pregnancy  . Generalized Body Aches  . Emesis   HPI:  See above  OB History   Grav Para Term Preterm Abortions TAB SAB Ect Mult Living   1 1 1  0 0 0 0 0 0 1    2007--primary C/S due to breech  Past Medical History  Diagnosis Date  . Mild acid reflux   . TMJ syndrome LEFT SIDE  . PONV (postoperative nausea and vomiting)   . Frequency of urination   . Urgency of urination   . Nocturia   . Interstitial cystitis   . Fibromyalgia     Past Surgical History  Procedure Laterality Date  . Cysto/ hod/ instillation clorpactin  03-10-2011;  MAR 2011;  2009    FOR I.C.  . Cesarean section  2007  . Cysto with hydrodistension  08/16/2012    Procedure: CYSTOSCOPY/HYDRODISTENSION;  Surgeon: Valetta Fuller, MD;  Location: [facility];  Service: Urology;  Laterality: N/A;    Family History  Problem Relation Age of Onset  . Diabetes Mother   . Cancer Mother 64    2003; BREAST  . Diverticulosis Mother   . Hyperlipidemia  Father     History  Substance Use Topics  . Smoking status: Never Smoker   . Smokeless tobacco: Never Used  . Alcohol Use: No     Comment: RARE    Allergies:  Allergies  Allergen Reactions  . Cymbalta [Duloxetine Hcl] Shortness Of Breath  . Darvocet [Propoxyphene-Acetaminophen] Hives    Prescriptions prior to admission  Medication Sig Dispense Refill  . cetirizine (ZYRTEC) 10 MG tablet Take 10 mg by mouth daily.      . clomiPHENE (CLOMID) 50 MG tablet Take 100 mg by mouth daily.      . Omalizumab (XOLAIR [place]) Inject into the skin every 30 (thirty) days.      Marland Kitchen darifenacin (ENABLEX) 15 MG 24 hr tablet Take 15 mg by mouth daily.      Marland Kitchen triamcinolone cream (KENALOG) 0.1 % Apply topically 2 (two) times daily.  30 g  0    ROS:  Body aches, nausea, occasional vomiting Physical Exam   Blood pressure 114/76, pulse 77, temperature 98.5 F (36.9 C), temperature source Oral, resp. rate 16, height 5\' 3"  (1.6 m), weight 174 lb 3.2 oz (79.017 kg), last menstrual period 08/20/2013.  Results for orders placed during the hospital encounter of 09/21/13 (from the past 24 hour(s))  URINALYSIS, ROUTINE W REFLEX MICROSCOPIC  Status: Abnormal   Collection Time    09/21/13  2:25 AM      Result Value Range   Color, Urine YELLOW  YELLOW   APPearance CLEAR  CLEAR   Specific Gravity, Urine 1.015  1.005 - 1.030   pH 7.0  5.0 - 8.0   Glucose, UA 100 (*) NEGATIVE mg/dL   Hgb urine dipstick NEGATIVE  NEGATIVE   Bilirubin Urine NEGATIVE  NEGATIVE   Ketones, ur NEGATIVE  NEGATIVE mg/dL   Protein, ur NEGATIVE  NEGATIVE mg/dL   Urobilinogen, UA 0.2  0.0 - 1.0 mg/dL   Nitrite NEGATIVE  NEGATIVE   Leukocytes, UA NEGATIVE  NEGATIVE  POCT PREGNANCY, URINE     Status: None   Collection Time    09/21/13  2:30 AM      Result Value Range   Preg Test, Ur NEGATIVE  NEGATIVE  HCG, QUANTITATIVE, PREGNANCY     Status: None   Collection Time    09/21/13  2:50 AM      Result Value Range   hCG, Beta Chain,  Quant, S 1  <5 mIU/mL  CBC WITH DIFFERENTIAL     Status: Abnormal   Collection Time    09/21/13  2:50 AM      Result Value Range   WBC 4.0  4.0 - 10.5 K/uL   RBC 3.88  3.87 - 5.11 MIL/uL   Hemoglobin 12.1  12.0 - 15.0 g/dL   HCT 54.0 (*) 98.1 - 19.1 %   MCV 87.6  78.0 - 100.0 fL   MCH 31.2  26.0 - 34.0 pg   MCHC 35.6  30.0 - 36.0 g/dL   RDW 47.8  29.5 - 62.1 %   Platelets 204  150 - 400 K/uL   Neutrophils Relative % 44  43 - 77 %   Neutro Abs 1.7  1.7 - 7.7 K/uL   Lymphocytes Relative 44  12 - 46 %   Lymphs Abs 1.8  0.7 - 4.0 K/uL   Monocytes Relative 11  3 - 12 %   Monocytes Absolute 0.4  0.1 - 1.0 K/uL   Eosinophils Relative 2  0 - 5 %   Eosinophils Absolute 0.1  0.0 - 0.7 K/uL   Basophils Relative 0  0 - 1 %   Basophils Absolute 0.0  0.0 - 0.1 K/uL  GLUCOSE, CAPILLARY     Status: Abnormal   Collection Time    09/21/13  3:42 AM      Result Value Range   Glucose-Capillary 105 (*) 70 - 99 mg/dL   Progesterone level pending.  Physical Exam In NAD Chest clear Heart RRR without murmur Abd soft, NT, no rebound or guarding, normal bowel sounds Pelvic--no CMT, adnexa NT, no masses. Ext WNL  ED Course  Viral-type sx D/C home with instructions to push fluids, rest, Tylenol. Reviewed CBG level, and implications of family hx of diabetes.  Will continue to monitor with primary MD. Keep scheduled appt at [facility] with Dr. Estanislado Pandy on 10/12/13. Declines med for nausea.   Nigel Bridgeman CNM, [place] 09/21/2013 3:47 AM

## 2014-01-24 ENCOUNTER — Encounter (HOSPITAL_COMMUNITY): Payer: Self-pay | Admitting: Emergency Medicine

## 2014-01-24 ENCOUNTER — Emergency Department (HOSPITAL_COMMUNITY)
Admission: EM | Admit: 2014-01-24 | Discharge: 2014-01-24 | Disposition: A | Discharge status: Home / Self Care | Payer: Managed Care, Other (non HMO) | Attending: Emergency Medicine | Admitting: Emergency Medicine

## 2014-01-24 DIAGNOSIS — Z87448 Personal history of other diseases of urinary system: Secondary | ICD-10-CM | POA: Insufficient documentation

## 2014-01-24 DIAGNOSIS — Z79899 Other long term (current) drug therapy: Secondary | ICD-10-CM | POA: Insufficient documentation

## 2014-01-24 DIAGNOSIS — Z8739 Personal history of other diseases of the musculoskeletal system and connective tissue: Secondary | ICD-10-CM | POA: Insufficient documentation

## 2014-01-24 DIAGNOSIS — R509 Fever, unspecified: Secondary | ICD-10-CM | POA: Insufficient documentation

## 2014-01-24 DIAGNOSIS — K219 Gastro-esophageal reflux disease without esophagitis: Secondary | ICD-10-CM | POA: Insufficient documentation

## 2014-01-24 DIAGNOSIS — L299 Pruritus, unspecified: Secondary | ICD-10-CM

## 2014-01-24 HISTORY — DX: Migraine, unspecified, not intractable, without status migrainosus: G43.909

## 2014-01-24 HISTORY — DX: Pruritus, unspecified: L29.9

## 2014-01-24 MED ORDER — LORAZEPAM 0.5 MG PO TABS
0.5000 mg | ORAL_TABLET | Freq: Once | ORAL | Status: AC
  Administered 2014-01-24: 0.5 mg via ORAL
  Filled 2014-01-24: qty 1

## 2014-01-24 MED ORDER — CAMPHOR-MENTHOL 0.5-0.5 % EX LOTN: 1.0000 "application " | TOPICAL_LOTION | CUTANEOUS | Status: DC | PRN

## 2014-01-24 NOTE — ED Provider Notes (Signed)
CSN: [id number]     Arrival date & time 01/24/14  1191 History  This chart was scribed for non-physician practitioner Annamaria Helling, PA-C working with [name], DO by Dorothey Baseman, ED Scribe. This patient was seen in room WTR6/WTR6 and the patient's care was started at 9:38 PM.    Chief Complaint  Patient presents with  . Fever  . Pruritis   The history is provided by the patient. No language interpreter was used.   HPI Comments: Karen Meyer is a 28 y.o. female with a history of interstitial cystitis, fibromyalgia, and recurrent/frequeny infections who presents to the Emergency Department complaining of intermittent fevers (usually around 100.6 measured at home when she has them, patient is afebrile at 98.4 in the ED) for the past 8 months with associated, diffuse, constant itching that she states has been ongoing for about 1.5 years. She states that the itching has been progressively worsening. Patient reports that a rash only presents after scratching the areas, but states that the itching presents before the rash. Patient states that she has been seen by her PCP who referred her to a dermatologist, rheumatologist, infectious disease specialist, and allergist for these complaints and has been given steroids, xolair injections, hydroxyzine, Neurontin, and anti-depressant medications. She states that these medications have not been able to provide significant relief and that her symptoms have not been definitively diagnosed. Patient reports that she was tested for Hepatitis and HIV last year (in [place]), which were negative, and that she last had blood work done in June, 2014. She denies cough, congestion, rhinorrhea, sore throat, emesis, diarrhea, abdominal pain, dysuria, weight loss, or any other symptoms at this time. Patient denies familial history of similar symptoms.   Past Medical History  Diagnosis Date  . Mild acid reflux   . TMJ syndrome LEFT SIDE  . PONV  (postoperative nausea and vomiting)   . Frequency of urination   . Urgency of urination   . Nocturia   . Interstitial cystitis   . Fibromyalgia    Past Surgical History  Procedure Laterality Date  . Cysto/ hod/ instillation clorpactin  03-10-2011;  MAR 2011;  2009    FOR I.C.  . Cesarean section  2007  . Cysto with hydrodistension  08/16/2012    Procedure: CYSTOSCOPY/HYDRODISTENSION;  Surgeon: Valetta Fuller, MD;  Location: [facility];  Service: Urology;  Laterality: N/A;   Family History  Problem Relation Age of Onset  . Diabetes Mother   . Cancer Mother 71    2003; BREAST  . Diverticulosis Mother   . Hyperlipidemia Father    History  Substance Use Topics  . Smoking status: Never Smoker   . Smokeless tobacco: Never Used  . Alcohol Use: No     Comment: RARE   OB History   Grav Para Term Preterm Abortions TAB SAB Ect Mult Living   1 1 1  0 0 0 0 0 0 1     Review of Systems  A complete 10 system review of systems was obtained and all systems are negative except as noted in the HPI and PMH.    Allergies  Cymbalta and Darvocet  Home Medications   Current Outpatient Rx  Name  Route  Sig  Dispense  Refill  . omeprazole (PRILOSEC) 20 MG capsule   Oral   Take 20 mg by mouth daily.          Triage Vitals: BP 123/72  Pulse 71  Temp(Src) 98.4 F (  36.9 C) (Oral)  Resp 18  Ht 5\' 3"  (1.6 m)  Wt 170 lb (77.111 kg)  BMI 30.12 kg/m2  SpO2 98%  LMP 01/20/2014  Physical Exam  Nursing note and vitals reviewed. Constitutional: She is oriented to person, place, and time. She appears well-developed and well-nourished. No distress.  HENT:  Head: Normocephalic and atraumatic.  Eyes: Conjunctivae are normal.  Neck: Normal range of motion. Neck supple.  Cardiovascular: Normal rate, regular rhythm and normal heart sounds.   Pulmonary/Chest: Effort normal and breath sounds normal. No respiratory distress.  Abdominal: She exhibits no distension.   Musculoskeletal: Normal range of motion.  Lymphadenopathy:    She has no cervical adenopathy.  Neurological: She is alert and oriented to person, place, and time.  Skin: Skin is warm and dry. No rash noted. No erythema.  Patient is scratching her scalp and arms.   Psychiatric: She has a normal mood and affect. Her behavior is normal.    ED Course  Procedures (including critical care time)  DIAGNOSTIC STUDIES: Oxygen Saturation is 98% on room air, normal by my interpretation.    COORDINATION OF CARE: 9:52 PM- Will order Ativan. Discussed treatment plan with patient at bedside and patient verbalized agreement.     Labs Review Labs Reviewed - No data to display Imaging Review No results found.   EKG Interpretation None      MDM   Final diagnoses:  Itch of skin    28yo F presents w/ two complaints.  Has had constant, diffuse pruritis x 1.5 years.  Has been evaluated by several dermatologists as well as an allergist, and had trial of multiple therapies, including anti-histamines, steroids, doxepin, neurontin and anti-depressants, w/out improvement.  Dermatographia but otherwise no rash or other associated sx.  Labs obtained to evaluate liver in the past. No significant exam findings today.  0.5mg  po ativan administered w/out any relief.  Will prescribe sarna for patient to try.  Recommended that she follow up with her dermatologist again.  Also c/o 47mo intermittent fever, particularly occuring in pm.  No associated sx.  Her PCP aware but was according to patient, unconcerned.  Had HIV testing ~627yr ago and does not have any RF for HIV.  Was evaluated by ID and rheumatology ~4 years ago for fevers, stomatitis and myalgias, but sx resolved w/ steroids and etiology was not determined.  I advised patient to f/u w/ her PCP.   These are chronic issues and there is no indication for work up in ER today.  I apologized that I was not able to offer any further assistance.    I personally  performed the services described in this documentation, which was scribed in my presence. The recorded information has been reviewed and is accurate.     Otilio Miuatherine E Corianna Avallone, PA-C 01/25/14 [phone]

## 2014-01-24 NOTE — ED Notes (Signed)
Pt has a ride home.  

## 2014-01-24 NOTE — ED Notes (Signed)
Per pt report: pt c/o of having intermittent fevers for the past 8 months and itching for over a year.  Pt reports itching has become worse and has been seen by a dermatologist. Pt denies any hives or rashes unless pt scratches herself.  Pt a/o x 4.  Skin warm, dry and intact.  NAD noted.

## 2014-01-24 NOTE — Discharge Instructions (Signed)
Try the lotion prescribed for itchiness of skin.  You can also try cool compresses.  Try to avoid scratching as well.  Follow up with your dermatologist and follow up with your primary care doctor for night time fevers.   You may return to the ER if symptoms worsen or you have any other concerns.

## 2014-01-27 NOTE — ED Provider Notes (Signed)
Medical screening examination/treatment/procedure(s) were performed by non-physician practitioner and as supervising physician I was immediately available for consultation/collaboration.   EKG Interpretation None        Karen Bellotti M Primo Innis, DO 01/27/14 1438 

## 2014-02-20 ENCOUNTER — Encounter (HOSPITAL_COMMUNITY): Payer: Self-pay | Admitting: *Deleted

## 2014-02-20 ENCOUNTER — Inpatient Hospital Stay (HOSPITAL_COMMUNITY)
Admission: AD | Admit: 2014-02-20 | Discharge: 2014-02-20 | Disposition: A | Discharge status: Home / Self Care | Payer: 59 | Source: Ambulatory Visit | Attending: Obstetrics & Gynecology | Admitting: Obstetrics & Gynecology

## 2014-02-20 ENCOUNTER — Inpatient Hospital Stay (HOSPITAL_COMMUNITY): Payer: 59

## 2014-02-20 DIAGNOSIS — K219 Gastro-esophageal reflux disease without esophagitis: Secondary | ICD-10-CM | POA: Insufficient documentation

## 2014-02-20 DIAGNOSIS — R102 Pelvic and perineal pain: Secondary | ICD-10-CM

## 2014-02-20 DIAGNOSIS — N949 Unspecified condition associated with female genital organs and menstrual cycle: Secondary | ICD-10-CM | POA: Insufficient documentation

## 2014-02-20 DIAGNOSIS — N946 Dysmenorrhea, unspecified: Secondary | ICD-10-CM | POA: Insufficient documentation

## 2014-02-20 DIAGNOSIS — G43909 Migraine, unspecified, not intractable, without status migrainosus: Secondary | ICD-10-CM | POA: Insufficient documentation

## 2014-02-20 DIAGNOSIS — IMO0001 Reserved for inherently not codable concepts without codable children: Secondary | ICD-10-CM | POA: Insufficient documentation

## 2014-02-20 LAB — CBC
HCT: 34.9 % — ABNORMAL LOW (ref 36.0–46.0)
HEMOGLOBIN: 12.1 g/dL (ref 12.0–15.0)
MCH: 30.9 pg (ref 26.0–34.0)
MCHC: 34.7 g/dL (ref 30.0–36.0)
MCV: 89 fL (ref 78.0–100.0)
PLATELETS: 209 10*3/uL (ref 150–400)
RBC: 3.92 MIL/uL (ref 3.87–5.11)
RDW: 12.6 % (ref 11.5–15.5)
WBC: 4.7 10*3/uL (ref 4.0–10.5)

## 2014-02-20 LAB — WET PREP, GENITAL
CLUE CELLS WET PREP: NONE SEEN
Trich, Wet Prep: NONE SEEN
YEAST WET PREP: NONE SEEN

## 2014-02-20 LAB — URINALYSIS, ROUTINE W REFLEX MICROSCOPIC
BILIRUBIN URINE: NEGATIVE
Glucose, UA: NEGATIVE mg/dL
KETONES UR: NEGATIVE mg/dL
NITRITE: NEGATIVE
PROTEIN: 30 mg/dL — AB
Specific Gravity, Urine: 1.025 (ref 1.005–1.030)
Urobilinogen, UA: 0.2 mg/dL (ref 0.0–1.0)
pH: 6 (ref 5.0–8.0)

## 2014-02-20 LAB — URINE MICROSCOPIC-ADD ON

## 2014-02-20 LAB — POCT PREGNANCY, URINE: Preg Test, Ur: NEGATIVE

## 2014-02-20 MED ORDER — KETOROLAC TROMETHAMINE 60 MG/2ML IM SOLN
60.0000 mg | INTRAMUSCULAR | Status: AC
  Administered 2014-02-20: 60 mg via INTRAMUSCULAR
  Filled 2014-02-20: qty 2

## 2014-02-20 NOTE — MAU Provider Note (Signed)
Attestation of Attending Supervision of Advanced Practitioner (CNM/NP): Evaluation and management procedures were performed by the Advanced Practitioner under my supervision and collaboration.  I have reviewed the Advanced Practitioner's note and chart, and I agree with the management and plan.  Willodean RosenthalCarolyn Harraway-Smith 7:48 PM

## 2014-02-20 NOTE — MAU Provider Note (Signed)
Chief Complaint: Abdominal Pain   First Provider Initiated Contact with Patient 02/20/14 1657     SUBJECTIVE HPI: Karen Meyer is a 28 y.o. G1P1001 who presents to maternity admissions reporting menses becoming more painful each month, with onset of menses yesterday with severe cramping of lower abdomen.  She reports her menstrual bleeding as moderate, changing her pad every 2-3 hours.  She reports seeing [facility] >1 year ago and taking Clomid to regulate periods and attempt to become pregnant.  Her periods did regulate, but she and her husband have not been able to conceive.  She reports her daughter was born 8 years ago, then she started Depo Provera for 6  Years.  She does not remember if periods were regular before the Depo.  She denies vaginal itching/burning, urinary symptoms, h/a, dizziness, n/v, or fever/chills.   Past Medical History  Diagnosis Date  . Mild acid reflux   . TMJ syndrome LEFT SIDE  . PONV (postoperative nausea and vomiting)   . Frequency of urination   . Urgency of urination   . Nocturia   . Interstitial cystitis   . Fibromyalgia   . Chronic pruritus   . Migraine headache    Past Surgical History  Procedure Laterality Date  . Cysto/ hod/ instillation clorpactin  03-10-2011;  MAR 2011;  2009    FOR I.C.  . Cesarean section  2007  . Cysto with hydrodistension  08/16/2012    Procedure: CYSTOSCOPY/HYDRODISTENSION;  Surgeon: Valetta Fulleravid S Grapey, MD;  [facility];  Service: Urology;  Laterality: N/A;   History   Social History  . Marital Status: Married    Spouse Name: Daphine DeutscherMartin    Number of Children: 1  . Years of Education: 16   Occupational History  . 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 Smoking status: Never Smoker   . Smokeless tobacco: Never Used  . Alcohol Use: No     Comment: RARE  . Drug Use: No  . Sexual Activity: Not Currently    Partners: Male    Birth Control/ Protection: None    Other Topics Concern  . Not on file   Social History Narrative   Definitely does not want to become pregnant again.   Current Facility-Administered Medications on File Prior to Encounter  Medication Dose Route Frequency Provider Last Rate Last Dose  . bupivacaine (MARCAINE) 0.25 % (with pres) injection 30 mL  30 mL Infiltration Once Valetta Fulleravid S Grapey, MD      . oxychlorosene (CLORPACTIN WCS 90) powder   Irrigation Once Valetta Fulleravid S Grapey, MD       Current Outpatient Prescriptions on File Prior to Encounter  Medication Sig Dispense Refill  . omeprazole (PRILOSEC) 20 MG capsule Take 20 mg by mouth daily.       Allergies  Allergen Reactions  . Cymbalta [Duloxetine Hcl] Shortness Of Breath  . Darvocet [Propoxyphene N-Acetaminophen] Hives    ROS: Pertinent items in HPI  OBJECTIVE Blood pressure 113/66, pulse 75, temperature 98.7 F (37.1 C), temperature source Oral, resp. rate 18, height 5' 2.5" (1.588 m), weight 78.019 kg (172 lb), last menstrual period 02/19/2014. GENERAL: Well-developed, well-nourished female in no acute distress.  HEENT: Normocephalic HEART: normal rate RESP: normal effort ABDOMEN: Soft, non-tender EXTREMITIES: Nontender, no edema NEURO: Alert and oriented Pelvic exam: Cervix pink, visually closed, without lesion, moderate amount dark red bleeding, vaginal walls and external genitalia normal Bimanual exam: Cervix 0/long/high, firm, anterior,  postive CMT, uterus mildly tender, nonenlarged, adnexa without tenderness, enlargement, or mass  LAB RESULTS Results for orders placed during the hospital encounter of 02/20/14 (from the past 24 hour(s))  URINALYSIS, ROUTINE W REFLEX MICROSCOPIC     Status: Abnormal   Collection Time    02/20/14  2:15 PM      Result Value Ref Range   Color, Urine RED (*) YELLOW   APPearance CLOUDY (*) CLEAR   Specific Gravity, Urine 1.025  1.005 - 1.030   pH 6.0  5.0 - 8.0   Glucose, UA NEGATIVE  NEGATIVE mg/dL   Hgb urine dipstick  LARGE (*) NEGATIVE   Bilirubin Urine NEGATIVE  NEGATIVE   Ketones, ur NEGATIVE  NEGATIVE mg/dL   Protein, ur 30 (*) NEGATIVE mg/dL   Urobilinogen, UA 0.2  0.0 - 1.0 mg/dL   Nitrite NEGATIVE  NEGATIVE   Leukocytes, UA TRACE (*) NEGATIVE  URINE MICROSCOPIC-ADD ON     Status: Abnormal   Collection Time    02/20/14  2:15 PM      Result Value Ref Range   Squamous Epithelial / LPF FEW (*) RARE   WBC, UA 0-2  <3 WBC/hpf   RBC / HPF TOO NUMEROUS TO COUNT  <3 RBC/hpf   Bacteria, UA MANY (*) RARE  CBC     Status: Abnormal   Collection Time    02/20/14  4:42 PM      Result Value Ref Range   WBC 4.7  4.0 - 10.5 K/uL   RBC 3.92  3.87 - 5.11 MIL/uL   Hemoglobin 12.1  12.0 - 15.0 g/dL   HCT 16.1 (*) 09.6 - 04.5 %   MCV 89.0  78.0 - 100.0 fL   MCH 30.9  26.0 - 34.0 pg   MCHC 34.7  30.0 - 36.0 g/dL   RDW 40.9  81.1 - 91.4 %   Platelets 209  150 - 400 K/uL  POCT PREGNANCY, URINE     Status: None   Collection Time    02/20/14  4:44 PM      Result Value Ref Range   Preg Test, Ur NEGATIVE  NEGATIVE  WET PREP, GENITAL     Status: Abnormal   Collection Time    02/20/14  5:06 PM      Result Value Ref Range   Yeast Wet Prep HPF POC NONE SEEN  NONE SEEN   Trich, Wet Prep NONE SEEN  NONE SEEN   Clue Cells Wet Prep HPF POC NONE SEEN  NONE SEEN   WBC, Wet Prep HPF POC FEW (*) NONE SEEN    IMAGING US Transvaginal Non-ob  02/20/2014   CLINICAL DATA:  Dysmenorrhea  EXAM: TRANSABDOMINAL AND TRANSVAGINAL ULTRASOUND OF PELVIS  TECHNIQUE: Both transabdominal and transvaginal ultrasound examinations of the pelvis were performed. Transabdominal technique was performed for global imaging of the pelvis including uterus, ovaries, adnexal regions, and pelvic cul-de-sac. It was necessary to proceed with endovaginal exam following the transabdominal exam to visualize the uterus and ovaries.  COMPARISON:  None  FINDINGS: Uterus  Measurements: 8.2 x 4.8 x 5.4 cm. No fibroids or other mass visualized.  Endometrium   Thickness: 8 mm.  No focal abnormality visualized.  Right ovary  Measurements: 3.9 x 2.0 x 1.6 cm. Normal appearance/no adnexal mass.  Left ovary  Measurements: 2.9 x 2.1 x 1.8 cm. Normal appearance/no adnexal mass.  Other findings  No free fluid.  IMPRESSION: No acute abnormality is identified within the pelvis.   Electronically  Signed   By: Alcide CleverMark  Lukens M.D.   On: 02/20/2014 17:56   [place] Pelvis Complete  02/20/2014   CLINICAL DATA:  Dysmenorrhea  EXAM: TRANSABDOMINAL AND TRANSVAGINAL ULTRASOUND OF PELVIS  TECHNIQUE: Both transabdominal and transvaginal ultrasound examinations of the pelvis were performed. Transabdominal technique was performed for global imaging of the pelvis including uterus, ovaries, adnexal regions, and pelvic cul-de-sac. It was necessary to proceed with endovaginal exam following the transabdominal exam to visualize the uterus and ovaries.  COMPARISON:  None  FINDINGS: Uterus  Measurements: 8.2 x 4.8 x 5.4 cm. No fibroids or other mass visualized.  Endometrium  Thickness: 8 mm.  No focal abnormality visualized.  Right ovary  Measurements: 3.9 x 2.0 x 1.6 cm. Normal appearance/no adnexal mass.  Left ovary  Measurements: 2.9 x 2.1 x 1.8 cm. Normal appearance/no adnexal mass.  Other findings  No free fluid.  IMPRESSION: No acute abnormality is identified within the pelvis.   Electronically Signed   By: Alcide CleverMark  Lukens M.D.   On: 02/20/2014 17:56    ASSESSMENT 1. Dysmenorrhea   2. Pelvic pain in female     PLAN Discharge home Urine sent for culture Reassurance provided of normal results F/U with Gyn provider for symptoms Return to MAU as needed for emergencies    Medication List         cetirizine 10 MG tablet  Commonly known as:  ZYRTEC  Take 10 mg by mouth daily.     loratadine 10 MG tablet  Commonly known as:  CLARITIN  Take 10 mg by mouth daily.     omeprazole 20 MG capsule  Commonly known as:  PRILOSEC  Take 20 mg by mouth daily.       Follow-up Information    [facility] (As needed. Return to MAU as needed for emergencies. )    Contact information:   [address] [place] [id number] [phone]      Sharen CounterLisa Leftwich-Kirby Certified Nurse-Midwife 02/20/2014  6:45 PM

## 2014-02-20 NOTE — MAU Note (Signed)
Period just started yesterday. Having abd pain, hurts really bad to even have a bowel movement.  Have noticed an increase in pain with periods.

## 2014-02-20 NOTE — Discharge Instructions (Signed)

## 2014-02-21 LAB — GC/CHLAMYDIA PROBE AMP
CT PROBE, AMP APTIMA: NEGATIVE
GC Probe RNA: NEGATIVE

## 2014-02-21 LAB — URINE CULTURE: Colony Count: 50000

## 2014-03-15 ENCOUNTER — Emergency Department (HOSPITAL_BASED_OUTPATIENT_CLINIC_OR_DEPARTMENT_OTHER)
Admission: EM | Admit: 2014-03-15 | Discharge: 2014-03-16 | Disposition: A | Discharge status: Home / Self Care | Payer: 59 | Attending: Emergency Medicine | Admitting: Emergency Medicine

## 2014-03-15 ENCOUNTER — Encounter (HOSPITAL_BASED_OUTPATIENT_CLINIC_OR_DEPARTMENT_OTHER): Payer: Self-pay | Admitting: Emergency Medicine

## 2014-03-15 DIAGNOSIS — K5900 Constipation, unspecified: Secondary | ICD-10-CM | POA: Insufficient documentation

## 2014-03-15 DIAGNOSIS — Z87448 Personal history of other diseases of urinary system: Secondary | ICD-10-CM | POA: Insufficient documentation

## 2014-03-15 DIAGNOSIS — IMO0001 Reserved for inherently not codable concepts without codable children: Secondary | ICD-10-CM

## 2014-03-15 DIAGNOSIS — Z8679 Personal history of other diseases of the circulatory system: Secondary | ICD-10-CM | POA: Insufficient documentation

## 2014-03-15 DIAGNOSIS — K219 Gastro-esophageal reflux disease without esophagitis: Secondary | ICD-10-CM | POA: Insufficient documentation

## 2014-03-15 DIAGNOSIS — Z872 Personal history of diseases of the skin and subcutaneous tissue: Secondary | ICD-10-CM | POA: Insufficient documentation

## 2014-03-15 DIAGNOSIS — Z8739 Personal history of other diseases of the musculoskeletal system and connective tissue: Secondary | ICD-10-CM | POA: Insufficient documentation

## 2014-03-15 HISTORY — DX: Gastro-esophageal reflux disease without esophagitis: K21.9

## 2014-03-15 MED ORDER — GI COCKTAIL ~~LOC~~
30.0000 mL | Freq: Once | ORAL | Status: AC
  Administered 2014-03-15: 30 mL via ORAL
  Filled 2014-03-15: qty 30

## 2014-03-15 MED ORDER — DICYCLOMINE HCL 10 MG/ML IM SOLN
20.0000 mg | Freq: Once | INTRAMUSCULAR | Status: AC
  Administered 2014-03-15: 20 mg via INTRAMUSCULAR
  Filled 2014-03-15: qty 2

## 2014-03-15 NOTE — ED Notes (Signed)
C/o chest pain after eating spicy foods, pain worse when she lays flat

## 2014-03-15 NOTE — ED Notes (Signed)
Pt states no relief from zantac that she took at 8pm tonight

## 2014-03-15 NOTE — ED Provider Notes (Signed)
CSN: [id number]     Arrival date & time 03/15/14  2219 History  This chart was scribed for Fleetwood Pierron Smitty CordsK Gared Gillie-Rasch, MD by Beverly MilchJ Harrison Collins, ED Scribe. This patient was seen in room MH11/MH11 and the patient's care was started at 11:14 PM.     Chief Complaint  Patient presents with  . Gastrophageal Reflux    Patient is a 28 y.o. female presenting with GERD. The history is provided by the patient. No language interpreter was used.  Gastrophageal Reflux This is a recurrent problem. The current episode started 3 to 5 hours ago. The problem occurs constantly. The problem has not changed since onset.Pertinent negatives include no abdominal pain, no headaches and no shortness of breath. Nothing aggravates the symptoms. Nothing relieves the symptoms. She has tried nothing for the symptoms. The treatment provided moderate relief.    HPI Comments: Karen Meyer is a 28 y.o. female who presents to the Emergency Department complaining of burning pain, gas and burping and increased rectal gas after eating spicy food earlier this evening. She states she had ground beef with chiles for lunch and dinner.    Past Medical History  Diagnosis Date  . Mild acid reflux   . TMJ syndrome LEFT SIDE  . PONV (postoperative nausea and vomiting)   . Frequency of urination   . Urgency of urination   . Nocturia   . Interstitial cystitis   . Fibromyalgia   . Chronic pruritus   . Migraine headache   . GERD (gastroesophageal reflux disease)     Past Surgical History  Procedure Laterality Date  . Cysto/ hod/ instillation clorpactin  03-10-2011;  MAR 2011;  2009    FOR I.C.  . Cesarean section  2007  . Cysto with hydrodistension  08/16/2012    Procedure: CYSTOSCOPY/HYDRODISTENSION;  Surgeon: Valetta Fulleravid S Grapey, MD;  [facility] [place];  Service: Urology;  Laterality: N/A;    Family History  Problem Relation Age of Onset  . Diabetes Mother   . Cancer Mother 4244    2003; BREAST  .  Diverticulosis Mother   . Hyperlipidemia Father     History  Substance Use Topics  . Smoking status: Never Smoker   . Smokeless tobacco: Never Used  . Alcohol Use: No     Comment: RARE    OB History   Grav Para Term Preterm Abortions TAB SAB Ect Mult Living   1 1 1  0 0 0 0 0 0 1      Review of Systems  Respiratory: Negative for shortness of breath.   Gastrointestinal: Negative for abdominal pain.       Flare up of GERD  Neurological: Negative for headaches.  All other systems reviewed and are negative.   Allergies  Cymbalta and Darvocet  Home Medications   Prior to Admission medications   Medication Sig Start Date End Date Taking? Authorizing Provider  cetirizine (ZYRTEC) 10 MG tablet Take 10 mg by mouth daily.   Yes Historical Provider, MD  loratadine (CLARITIN) 10 MG tablet Take 10 mg by mouth daily.   Yes Historical Provider, MD  omeprazole (PRILOSEC) 20 MG capsule Take 20 mg by mouth daily.   Yes Historical Provider, MD    Triage Vitals: BP 121/71  Pulse 83  Temp(Src) 99 F (37.2 C) (Oral)  Resp 18  Ht 5\' 4"  (1.626 m)  Wt 170 lb (77.111 kg)  BMI 29.17 kg/m2  SpO2 99%  LMP 02/19/2014   Physical Exam  Nursing note  and vitals reviewed. Constitutional: She appears well-developed and well-nourished. No distress.  HENT:  Head: Normocephalic and atraumatic.  Right Ear: External ear normal.  Left Ear: External ear normal.  Acid visible in the back of the throat.  Eyes: Conjunctivae are normal. Right eye exhibits no discharge. Left eye exhibits no discharge. No scleral icterus.  Neck: Neck supple. No tracheal deviation present.  Cardiovascular: Normal rate, regular rhythm and intact distal pulses.   Pulmonary/Chest: Effort normal and breath sounds normal. No stridor. No respiratory distress. She has no wheezes. She has no rales.  Abdominal: Soft. She exhibits no distension. Bowel sounds are increased. There is no tenderness. There is no rebound and no  guarding.  Audible gurgling in the epigastrium and refluxing into the esophagus  Musculoskeletal: She exhibits no edema and no tenderness.  Neurological: She is alert. She has normal strength. No cranial nerve deficit (no facial droop, extraocular movements intact, no slurred speech) or sensory deficit. She exhibits normal muscle tone. She displays no seizure activity. Coordination normal.  Skin: Skin is warm and dry. No rash noted.  Psychiatric: She has a normal mood and affect.    ED Course  Procedures (including critical care time)   DIAGNOSTIC STUDIES: Oxygen Saturation is 99% on RA, normal by my interpretation.     COORDINATION OF CARE: 11:22 PM- Pt advised of plan for treatment and pt agrees.   Labs Review Labs Reviewed - No data to display  Imaging Review No results found.   EKG Interpretation   Date/Time:  Wednesday Mar 15 2014 22:36:06 EDT Ventricular Rate:  84 PR Interval:  130 QRS Duration: 84 QT Interval:  366 QTC Calculation: 432 R Axis:   59 Text Interpretation:  Normal sinus rhythm with sinus arrhythmia Normal ECG  No previous tracing Confirmed by KNAPP  MD-J, JON ([id number]) on 03/15/2014  10:49:48 PM      MDM  PERC negative wells 0, doubt PE with eating no long car trips or plane trips doubt cardiac etiology.   Final diagnoses:  None   Highly doubt cardiac and pulmonary etiology.  Symptoms start in the abdomen post eating.   GI cocktail caused her to start burping more, has a lot of gas as well.  Will prescribe carafate and recommend a bland diet and gas ex.  As well as follow up with GI return for worsening symptoms     I personally performed the services described in this documentation, which was scribed in my presence. The recorded information has been reviewed and is accurate.     Sairah Knobloch Smitty CordsK Mariabella Nilsen-Rasch, MD 03/16/14 0111

## 2014-03-15 NOTE — ED Notes (Signed)
No old EKG found  

## 2014-03-16 ENCOUNTER — Emergency Department (HOSPITAL_BASED_OUTPATIENT_CLINIC_OR_DEPARTMENT_OTHER): Payer: 59

## 2014-03-16 ENCOUNTER — Encounter (HOSPITAL_BASED_OUTPATIENT_CLINIC_OR_DEPARTMENT_OTHER): Payer: Self-pay | Admitting: Emergency Medicine

## 2014-03-16 LAB — CBC WITH DIFFERENTIAL/PLATELET
Basophils Absolute: 0 10*3/uL (ref 0.0–0.1)
Basophils Relative: 0 % (ref 0–1)
EOS PCT: 1 % (ref 0–5)
Eosinophils Absolute: 0.1 10*3/uL (ref 0.0–0.7)
HEMATOCRIT: 38.6 % (ref 36.0–46.0)
HEMOGLOBIN: 13.5 g/dL (ref 12.0–15.0)
LYMPHS ABS: 2.4 10*3/uL (ref 0.7–4.0)
Lymphocytes Relative: 42 % (ref 12–46)
MCH: 31.5 pg (ref 26.0–34.0)
MCHC: 35 g/dL (ref 30.0–36.0)
MCV: 90 fL (ref 78.0–100.0)
MONOS PCT: 10 % (ref 3–12)
Monocytes Absolute: 0.6 10*3/uL (ref 0.1–1.0)
Neutro Abs: 2.6 10*3/uL (ref 1.7–7.7)
Neutrophils Relative %: 46 % (ref 43–77)
Platelets: 217 10*3/uL (ref 150–400)
RBC: 4.29 MIL/uL (ref 3.87–5.11)
RDW: 12.4 % (ref 11.5–15.5)
WBC: 5.7 10*3/uL (ref 4.0–10.5)

## 2014-03-16 LAB — COMPREHENSIVE METABOLIC PANEL
ALT: 16 U/L (ref 0–35)
AST: 19 U/L (ref 0–37)
Albumin: 3.6 g/dL (ref 3.5–5.2)
Alkaline Phosphatase: 51 U/L (ref 39–117)
BUN: 15 mg/dL (ref 6–23)
CALCIUM: 9.4 mg/dL (ref 8.4–10.5)
CHLORIDE: 102 meq/L (ref 96–112)
CO2: 21 mEq/L (ref 19–32)
Creatinine, Ser: 0.9 mg/dL (ref 0.50–1.10)
GFR calc non Af Amer: 86 mL/min — ABNORMAL LOW (ref >90)
GLUCOSE: 106 mg/dL — AB (ref 70–99)
Potassium: 4.1 mEq/L (ref 3.7–5.3)
Sodium: 138 mEq/L (ref 137–147)
Total Bilirubin: 0.4 mg/dL (ref 0.3–1.2)
Total Protein: 7.5 g/dL (ref 6.0–8.3)

## 2014-03-16 LAB — LIPASE, BLOOD: Lipase: 32 U/L (ref 11–59)

## 2014-03-16 MED ORDER — POLYETHYLENE GLYCOL 3350 17 G PO PACK: 17.0000 g | PACK | Freq: Every day | ORAL | Status: DC

## 2014-03-16 MED ORDER — KETOROLAC TROMETHAMINE 60 MG/2ML IM SOLN
60.0000 mg | Freq: Once | INTRAMUSCULAR | Status: AC
  Administered 2014-03-16: 60 mg via INTRAMUSCULAR
  Filled 2014-03-16: qty 2

## 2014-03-16 MED ORDER — SUCRALFATE 1 GM/10ML PO SUSP: 1.0000 g | Freq: Three times a day (TID) | ORAL | Status: DC

## 2014-06-23 ENCOUNTER — Encounter (HOSPITAL_BASED_OUTPATIENT_CLINIC_OR_DEPARTMENT_OTHER): Payer: Self-pay | Admitting: Emergency Medicine

## 2014-06-23 ENCOUNTER — Emergency Department (HOSPITAL_BASED_OUTPATIENT_CLINIC_OR_DEPARTMENT_OTHER)
Admission: EM | Admit: 2014-06-23 | Discharge: 2014-06-23 | Disposition: A | Discharge status: Home / Self Care | Payer: 59 | Attending: Emergency Medicine | Admitting: Emergency Medicine

## 2014-06-23 DIAGNOSIS — Z872 Personal history of diseases of the skin and subcutaneous tissue: Secondary | ICD-10-CM | POA: Insufficient documentation

## 2014-06-23 DIAGNOSIS — Y929 Unspecified place or not applicable: Secondary | ICD-10-CM | POA: Insufficient documentation

## 2014-06-23 DIAGNOSIS — Z8739 Personal history of other diseases of the musculoskeletal system and connective tissue: Secondary | ICD-10-CM | POA: Insufficient documentation

## 2014-06-23 DIAGNOSIS — S4490XA Injury of unspecified nerve at shoulder and upper arm level, unspecified arm, initial encounter: Secondary | ICD-10-CM | POA: Diagnosis not present

## 2014-06-23 DIAGNOSIS — X58XXXA Exposure to other specified factors, initial encounter: Secondary | ICD-10-CM | POA: Insufficient documentation

## 2014-06-23 DIAGNOSIS — R209 Unspecified disturbances of skin sensation: Secondary | ICD-10-CM | POA: Insufficient documentation

## 2014-06-23 DIAGNOSIS — Y939 Activity, unspecified: Secondary | ICD-10-CM | POA: Diagnosis not present

## 2014-06-23 DIAGNOSIS — Z8679 Personal history of other diseases of the circulatory system: Secondary | ICD-10-CM | POA: Diagnosis not present

## 2014-06-23 DIAGNOSIS — K219 Gastro-esophageal reflux disease without esophagitis: Secondary | ICD-10-CM | POA: Diagnosis not present

## 2014-06-23 DIAGNOSIS — S4492XA Injury of unspecified nerve at shoulder and upper arm level, left arm, initial encounter: Secondary | ICD-10-CM

## 2014-06-23 DIAGNOSIS — Z79899 Other long term (current) drug therapy: Secondary | ICD-10-CM | POA: Diagnosis not present

## 2014-06-23 DIAGNOSIS — R12 Heartburn: Secondary | ICD-10-CM | POA: Diagnosis not present

## 2014-06-23 MED ORDER — GI COCKTAIL ~~LOC~~
30.0000 mL | Freq: Once | ORAL | Status: AC
  Administered 2014-06-23: 30 mL via ORAL
  Filled 2014-06-23: qty 30

## 2014-06-23 NOTE — Discharge Instructions (Signed)
Gastroesophageal Reflux Disease, Adult Gastroesophageal reflux disease (GERD) happens when acid from your stomach flows up into the esophagus. When acid comes in contact with the esophagus, the acid causes soreness (inflammation) in the esophagus. Over time, GERD may create small holes (ulcers) in the lining of the esophagus. CAUSES   Increased body weight. This puts pressure on the stomach, making acid rise from the stomach into the esophagus.  Smoking. This increases acid production in the stomach.  Drinking alcohol. This causes decreased pressure in the lower esophageal sphincter (valve or ring of muscle between the esophagus and stomach), allowing acid from the stomach into the esophagus.  Late evening meals and a full stomach. This increases pressure and acid production in the stomach.  A malformed lower esophageal sphincter. Sometimes, no cause is found. SYMPTOMS   Burning pain in the lower part of the mid-chest behind the breastbone and in the mid-stomach area. This may occur twice a week or more often.  Trouble swallowing.  Sore throat.  Dry cough.  Asthma-like symptoms including chest tightness, shortness of breath, or wheezing. DIAGNOSIS  Your caregiver may be able to diagnose GERD based on your symptoms. In some cases, X-rays and other tests may be done to check for complications or to check the condition of your stomach and esophagus. TREATMENT  Your caregiver may recommend over-the-counter or prescription medicines to help decrease acid production. Ask your caregiver before starting or adding any new medicines.  HOME CARE INSTRUCTIONS   Change the factors that you can control. Ask your caregiver for guidance concerning weight loss, quitting smoking, and alcohol consumption.  Avoid foods and drinks that make your symptoms worse, such as:  Caffeine or alcoholic drinks.  Chocolate.  Peppermint or mint flavorings.  Garlic and onions.  Spicy foods.  Citrus fruits,  such as oranges, lemons, or limes.  Tomato-based foods such as sauce, chili, salsa, and pizza.  Fried and fatty foods.  Avoid lying down for the 3 hours prior to your bedtime or prior to taking a nap.  Eat small, frequent meals instead of large meals.  Wear loose-fitting clothing. Do not wear anything tight around your waist that causes pressure on your stomach.  Raise the head of your bed 6 to 8 inches with wood blocks to help you sleep. Extra pillows will not help.  Only take over-the-counter or prescription medicines for pain, discomfort, or fever as directed by your caregiver.  Do not take aspirin, ibuprofen, or other nonsteroidal anti-inflammatory drugs (NSAIDs). SEEK IMMEDIATE MEDICAL CARE IF:   You have pain in your arms, neck, jaw, teeth, or back.  Your pain increases or changes in intensity or duration.  You develop nausea, vomiting, or sweating (diaphoresis).  You develop shortness of breath, or you faint.  Your vomit is green, yellow, black, or looks like coffee grounds or blood.  Your stool is red, bloody, or black. These symptoms could be signs of other problems, such as heart disease, gastric bleeding, or esophageal bleeding. MAKE SURE YOU:   Understand these instructions.  Will watch your condition.  Will get help right away if you are not doing well or get worse. Document Released: 08/06/2005 Document Revised: 01/19/2012 Document Reviewed: 05/16/2011 [facility] Patient Information 2015 [place], [place]. This information is not intended to replace advice given to you by your health care provider. Make sure you discuss any questions you have with your health care provider.  Neurapraxia Neurapraxia is a temporary loss of nerve function. It does not cause permanent damage  to a nerve. If your foot "falls asleep," that is a type of neurapraxia. It will go away as soon as you start moving your foot. A more serious neurapraxia could take up to 6 weeks to go  away. CAUSES   Anything that strains a nerve can cause neurapraxia. The nerve might be stretched or twisted. Something might press or pound on it and cause decreased blood flow to the nerve. Causes of neurapraxia can include:  Bones that break (fracture) or move out of place (dislocation). This can cause the nerves to stretch or twist out of their normal position. This happens most often in the arms and shoulders.  Neck strain when the head moves suddenly, such as in a car crash. This is called whiplash (cervical neurapraxia). It can also happen while playing sports. For example, a football injury called a stinger is a type of neurapraxia. It causes severe pain to shoot down an arm.  Stretching the neck too far from the shoulder. This damages the nerves that go into the shoulder, arm, and hand. It causes numbness and muscle weakness. This condition is called brachial plexus neurapraxia.  Repeated or prolonged pressure can cause decreased blood flow to the nerve (ischemic neurapraxia). Such causes of neurapraxia can include:  A cast or bandage that is too tight around an injured arm or leg. This limits blood flow to the nerves and causes a condition called compartment syndrome. This condition develops when pressure builds up around muscles and nerves.  Infection and disease. This can cut off blood flow to the nerve.  Very cold temperatures.  Sleeping in a position that limits blood flow to your leg or arm. SIGNS AND SYMPTOMS  Numbness and tingling.  Muscle weakness.  Burning pain.  Cool skin. DIAGNOSIS  Neurapraxia is diagnosed through:  A physical exam. This will include asking questions about your health. Your health care provider will ask about any symptoms you are having. Your health care provider may also:  Test how strong your muscles are.  Check feeling (sensation) in various areas of your body. A very light touch or pricks with a pin may be used.  Check whether you have  signs of nerve damage on one side of the body or both.  Tests such as:  Electromyography (EMG). This test measures electrical activity in a muscle. It shows whether the nerve that supplies the muscle is working.  Nerve conduction studies (NCS). They measure the flow of electricity through a nerve.  Magnetic resonance imaging (MRI). This is a machine that uses magnets and a computer to create pictures of your nerves. TREATMENT  Treatment aims to ease pain and swelling and to provide support while your body heals.  Medicine may include:  Pain medicine.  Antidepressants.  Seizure medicine.  Medicine to reduce swelling.  For support, options may include:  Braces, walkers, or crutches.  Physical therapy. Having a specialist work with you often speeds healing. It can also help prevent stiffness and future damage.  Surgery. This may be needed if broken or dislocated bones are part of the problem. Surgery also may be done to relieve pressure on nerves or to restore normal blood flow to nerves and muscles.  Electrical stimulators. These devices send pulses of electricity into the muscles. The aim is to bring back movement. HOME CARE INSTRUCTIONS What you need to do at home will vary. It will depend on your treatment plan and the type of neurapraxia you have. In general:  Take medicine as  told by your health care provider. Follow the directions carefully.  Rest. Give your body time to heal.  Use any splints, braces, or other support devices as directed. If you have questions about their use, ask your health care provider.  Start physical therapy, if that is suggested. Ask if it is okay to practice the exercises at home, too.  If areas of your body are numb, take care to protect them from burns or other injury.  If you had surgery, you will need to care for your surgical cut (incision). Ask for instructions before you leave the hospital.  Keep all follow-up appointments with your  health care provider. SEEK MEDICAL CARE IF:   You have any questions about your medicine.  Numbness or muscle weakness continues.  Pain continues, even after taking pain medicine. SEEK IMMEDIATE MEDICAL CARE IF:   Your pain suddenly becomes severe.  Numbness or weakness gets much worse.  Your muscles start to twitch or you have muscle spasms. Document Released: 03/31/2011 Document Revised: 03/13/2014 Document Reviewed: 03/31/2011 [facility] Patient Information 2015 [place]. This information is not intended to replace advice given to you by your health care provider. Make sure you discuss any questions you have with your health care provider.

## 2014-06-23 NOTE — ED Provider Notes (Signed)
CSN: [id number]     Arrival date & time 06/23/14  0443 History   First MD Initiated Contact with Patient 06/23/14 0457     Chief Complaint  Patient presents with  . Numbness  . Heartburn     (Consider location/radiation/quality/duration/timing/severity/associated sxs/prior Treatment) HPI Comments: Patient is a 28 year old female with history of acid reflux and fibromyalgia. She presents today with complaints of left arm numbness and heartburn which woke her from sleep approximately one hour prior to arrival. She states her left arm feels tingly. She denies any neck pain, headache, visual disturbances, or involvement of her leg or face. She has a history of heartburn and takes Prilosec for this.  Patient is a 28 y.o. female presenting with heartburn. The history is provided by the patient.  Heartburn This is a recurrent problem. The current episode started less than 1 hour ago. The problem occurs constantly. The problem has not changed since onset.Pertinent negatives include no chest pain. Nothing aggravates the symptoms. Nothing relieves the symptoms. She has tried nothing for the symptoms. The treatment provided no relief.    Past Medical History  Diagnosis Date  . Mild acid reflux   . TMJ syndrome LEFT SIDE  . PONV (postoperative nausea and vomiting)   . Frequency of urination   . Urgency of urination   . Nocturia   . Interstitial cystitis   . Fibromyalgia   . Chronic pruritus   . Migraine headache   . GERD (gastroesophageal reflux disease)    Past Surgical History  Procedure Laterality Date  . Cysto/ hod/ instillation clorpactin  03-10-2011;  MAR 2011;  2009    FOR I.C.  . Cesarean section  2007  . Cysto with hydrodistension  08/16/2012    Procedure: CYSTOSCOPY/HYDRODISTENSION;  Surgeon: Valetta Fulleravid S Grapey, MD;  [facility] [place];  Service: Urology;  Laterality: N/A;   Family History  Problem Relation Age of Onset  . Diabetes Mother   . Cancer Mother [id number]   2003; BREAST  . Diverticulosis Mother   . Hyperlipidemia Father    History  Substance Use Topics  . Smoking status: Never Smoker   . Smokeless tobacco: Never Used  . Alcohol Use: No     Comment: RARE   OB History   Grav Para Term Preterm Abortions TAB SAB Ect Mult Living   1 1 1  0 0 0 0 0 0 1     Review of Systems  Cardiovascular: Negative for chest pain.  Gastrointestinal: Positive for heartburn.  All other systems reviewed and are negative.     Allergies  Cymbalta and Darvocet  Home Medications   Prior to Admission medications   Medication Sig Start Date End Date Taking? Authorizing Provider  cetirizine (ZYRTEC) 10 MG tablet Take 10 mg by mouth daily.    Historical Provider, MD  loratadine (CLARITIN) 10 MG tablet Take 10 mg by mouth daily.    Historical Provider, MD  omeprazole (PRILOSEC) 20 MG capsule Take 20 mg by mouth daily.    Historical Provider, MD  polyethylene glycol (MIRALAX / GLYCOLAX) packet Take 17 g by mouth daily. 03/16/14   April K Palumbo-Rasch, MD  sucralfate (CARAFATE) 1 GM/10ML suspension Take 10 mLs (1 g total) by mouth 4 (four) times daily -  with meals and at bedtime. 03/16/14   April K Palumbo-Rasch, MD   BP 123/77  Pulse 71  Temp(Src) 97.8 F (36.6 C) (Oral)  Resp 16  Ht 5\' 4"  (1.626 m)  Wt 158  lb (71.668 kg)  BMI 27.11 kg/m2  SpO2 100%  LMP 06/23/2014 Physical Exam  Nursing note and vitals reviewed. Constitutional: She is oriented to person, place, and time. She appears well-developed and well-nourished. No distress.  HENT:  Head: Normocephalic and atraumatic.  Neck: Normal range of motion. Neck supple.  Cardiovascular: Normal rate and regular rhythm.  Exam reveals no gallop and no friction rub.   No murmur heard. Pulmonary/Chest: Effort normal and breath sounds normal. No respiratory distress. She has no wheezes.  Abdominal: Soft. Bowel sounds are normal. She exhibits no distension. There is no tenderness.  Musculoskeletal: Normal  range of motion.  Neurological: She is alert and oriented to person, place, and time. She has normal reflexes. No cranial nerve deficit. She exhibits normal muscle tone. Coordination normal.  Strength is 5 out of 5 in the bilateral upper extremities. Biceps and radial reflexes are 1+ and symmetrical.  Skin: Skin is warm and dry. She is not diaphoretic.    ED Course  Procedures (including critical care time) Labs Review Labs Reviewed - No data to display  Imaging Review No results found.   EKG Interpretation None      MDM   Final diagnoses:  None    Patient presents with complaints of heartburn that woke her from sleep and a tingling sensation in her left arm. She says that this heartburn feels like her similar episodes and improved with a GI cocktail. She states that her left arm feels tingly, however this seems to be improving in her strength is normal. I suspect some form of neuropraxia related from sleeping in an awkward position. There is nothing to me that suggest a cervical radiculopathy and I highly doubt a cardiac etiology. I feel as though she is appropriate for discharge.    Geoffery Lyons, MD 06/23/14 [phone]

## 2014-06-23 NOTE — ED Notes (Signed)
Pt discharged to home with family. NAD.  

## 2014-06-23 NOTE — ED Notes (Signed)
Pt presents to ED with complaints of left arm "feels like it is asleep" tingling and heartburn. Pt states the 2 complaints woke her up tonight and she could not go back to sleep.

## 2014-09-11 ENCOUNTER — Encounter (HOSPITAL_BASED_OUTPATIENT_CLINIC_OR_DEPARTMENT_OTHER): Payer: Self-pay | Admitting: Emergency Medicine

## 2015-01-21 ENCOUNTER — Emergency Department: Payer: Self-pay | Admitting: Student

## 2015-02-20 ENCOUNTER — Emergency Department
Admit: 2015-02-20 | Disposition: A | Discharge status: Home / Self Care | Payer: Self-pay | Admitting: Emergency Medicine

## 2015-02-20 LAB — CBC
HCT: 37.2 % (ref 35.0–47.0)
HGB: 12.2 g/dL (ref 12.0–16.0)
MCH: 30.7 pg (ref 26.0–34.0)
MCHC: 32.9 g/dL (ref 32.0–36.0)
MCV: 93 fL (ref 80–100)
PLATELETS: 246 10*3/uL (ref 150–440)
RBC: 3.98 10*6/uL (ref 3.80–5.20)
RDW: 13.2 % (ref 11.5–14.5)
WBC: 4.4 10*3/uL (ref 3.6–11.0)

## 2015-02-20 LAB — URINALYSIS, COMPLETE
Bacteria: NONE SEEN
Bilirubin,UR: NEGATIVE
Blood: NEGATIVE
Ketone: NEGATIVE
NITRITE: NEGATIVE
PH: 6 (ref 4.5–8.0)
Specific Gravity: 1.033 (ref 1.003–1.030)

## 2015-02-20 LAB — HCG, QUANTITATIVE, PREGNANCY: Beta Hcg, Quant.: 149 m[IU]/mL — ABNORMAL HIGH

## 2015-02-20 LAB — WET PREP, GENITAL

## 2015-02-23 LAB — GC/CHLAMYDIA PROBE AMP

## 2015-02-27 ENCOUNTER — Encounter (HOSPITAL_COMMUNITY): Payer: Self-pay

## 2015-02-27 ENCOUNTER — Inpatient Hospital Stay (HOSPITAL_COMMUNITY)
Admission: AD | Admit: 2015-02-27 | Discharge: 2015-02-27 | Disposition: A | Discharge status: Home / Self Care | Payer: No Typology Code available for payment source | Source: Ambulatory Visit | Attending: Obstetrics & Gynecology | Admitting: Obstetrics & Gynecology

## 2015-02-27 DIAGNOSIS — Z3200 Encounter for pregnancy test, result unknown: Secondary | ICD-10-CM | POA: Diagnosis present

## 2015-02-27 DIAGNOSIS — Z3201 Encounter for pregnancy test, result positive: Secondary | ICD-10-CM | POA: Diagnosis not present

## 2015-02-27 DIAGNOSIS — E349 Endocrine disorder, unspecified: Secondary | ICD-10-CM

## 2015-02-27 DIAGNOSIS — O0281 Inappropriate change in quantitative human chorionic gonadotropin (hCG) in early pregnancy: Secondary | ICD-10-CM

## 2015-02-27 LAB — URINE MICROSCOPIC-ADD ON

## 2015-02-27 LAB — URINALYSIS, ROUTINE W REFLEX MICROSCOPIC
Bilirubin Urine: NEGATIVE
GLUCOSE, UA: NEGATIVE mg/dL
HGB URINE DIPSTICK: NEGATIVE
Ketones, ur: NEGATIVE mg/dL
Nitrite: NEGATIVE
Protein, ur: NEGATIVE mg/dL
SPECIFIC GRAVITY, URINE: 1.02 (ref 1.005–1.030)
Urobilinogen, UA: 0.2 mg/dL (ref 0.0–1.0)
pH: 6.5 (ref 5.0–8.0)

## 2015-02-27 LAB — POCT PREGNANCY, URINE: Preg Test, Ur: POSITIVE — AB

## 2015-02-27 LAB — ABO/RH: ABO/RH(D): A POS

## 2015-02-27 LAB — HCG, QUANTITATIVE, PREGNANCY: HCG, BETA CHAIN, QUANT, S: 51 m[IU]/mL — AB (ref <5)

## 2015-02-27 NOTE — MAU Provider Note (Signed)
Chief Complaint: Nausea  First Provider Initiated Contact with Patient 02/27/15 1203     SUBJECTIVE HPI: Karen BuckerMargiana Karen Meyer is a 29 y.o. G2P1011 at 4 week S/P SAB on 01/30/15 who presents to Maternity Admissions reporting nausea and concern for possible new pregnancy. [facility]. Ultrasound showed 6 week 1 day live single intrauterine pregnancy with a fetal heart rate of 124. Quant was 43,726. GC/Chlamydia, wet prep negative. Blood type A+. 01/30/2015: Patient had heavy bleeding and passed multiple clots and tissue consistent with completed miscarriage. Bleeding continued 1 week. 02/20/2015: [facility] for abdominal pain, nausea and vaginal discharge. She is concerned about possible new pregnancy because she had had unprotected intercourse. Quant was 149. Ultrasound showed 8 mm complex fluid in endometrium. No gestational sac. Patient was instructed to follow-up for another Quant to determine if her Karen ButtersQuant was dropping from the miscarriage or rising in a new pregnancy. Patient came to maternity admissions follow[place]up [place].  Patient denies abdominal pain, vaginal bleeding. She is in a mutually monogamous relationship with her fianc. Does not desire pregnancy.  Past Medical History  Diagnosis Date  . Mild acid reflux   . TMJ syndrome LEFT SIDE  . PONV (postoperative nausea and vomiting)   . Frequency of urination   . Urgency of urination   . Nocturia   . Interstitial cystitis   . Fibromyalgia   . Chronic pruritus   . Migraine headache   . GERD (gastroesophageal reflux disease)    OB History  Gravida Para Term Preterm AB SAB TAB Ectopic Multiple Living  2 1 1  0 1 1 0 0 0 1    # Outcome Date GA Lbr Len/2nd Weight Sex Delivery Anes PTL Lv  2 SAB           1 Term              Past Surgical History  Procedure Laterality Date  . Cysto/ hod/ instillation clorpactin  03-10-2011;   MAR 2011;  2009    FOR I.C.  . Cesarean section  2007  . Cysto with hydrodistension  08/16/2012    Procedure: CYSTOSCOPY/HYDRODISTENSION;  Surgeon: Valetta Fulleravid S Grapey, MD;  [facility] [place];  Service: Urology;  Laterality: N/A;   History   Social History  . Marital Status: Engaged    Spouse Name:   . Number of Children: 1  . Years of Education: 16   Occupational History  . 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 Smoking status: Never Smoker   . Smokeless tobacco: Never Used  . Alcohol Use: No     Comment: RARE  . Drug Use: No  . Sexual Activity:    Partners: Male    Birth Control/ Protection: None   Other Topics Concern  . Not on file   Social History Narrative   Definitely does not want to become pregnant again.   Current Facility-Administered Medications on File Prior to Encounter  Medication Dose Route Frequency Provider Last Rate Last Dose  . bupivacaine (MARCAINE) 0.25 % (with pres) injection 30 mL  30 mL Infiltration Once Barron Alvineavid Grapey, MD      . oxychlorosene (CLORPACTIN WCS 90) powder   Irrigation Once Barron Alvineavid Grapey, MD       Current Outpatient Prescriptions on File Prior to Encounter  Medication Sig Dispense Refill  . cetirizine (ZYRTEC) 10 MG tablet Take  10 mg by mouth at bedtime.     . polyethylene glycol (MIRALAX / GLYCOLAX) packet Take 17 g by mouth daily. (Patient not taking: Reported on 02/27/2015) 14 each 0  . sucralfate (CARAFATE) 1 GM/10ML suspension Take 10 mLs (1 g total) by mouth 4 (four) times daily -  with meals and at bedtime. (Patient not taking: Reported on 02/27/2015) 420 mL 0   Allergies  Allergen Reactions  . Cymbalta [Duloxetine Hcl] Shortness Of Breath  . Darvocet [Propoxyphene N-Acetaminophen] Hives    Review of Systems  Constitutional: Negative for fever and chills.  Gastrointestinal: Positive for nausea. Negative for vomiting, abdominal pain, diarrhea and constipation.  Genitourinary:  Negative for dysuria, urgency, frequency and hematuria.       Negative for vaginal bleeding or vaginal discharge.    OBJECTIVE Blood pressure 122/72, pulse 70, temperature 98 F (36.7 C), temperature source Oral, resp. rate 16, height  (1.626 m), weight 161 lb 9.6 oz (73.301 kg). GENERAL: Well-developed, well-nourished female in no acute distress.  HEART: normal rate RESP: normal effort GI: Abdomen soft, non-tender. MS: Nontender, no edema NEURO: Alert and oriented SPECULUM EXAM: Deferred  LAB RESULTS Results for orders placed or performed during the hospital encounter of 02/27/15 (from the past 24 hour(s))  Pregnancy, urine POC     Status: Abnormal   Collection Time: 02/27/15 11:19 AM  Result Value Ref Range   Preg Test, Ur POSITIVE (A) NEGATIVE  Urinalysis, Routine w reflex microscopic     Status: Abnormal   Collection Time: 02/27/15 11:31 AM  Result Value Ref Range   Color, Urine YELLOW YELLOW   APPearance HAZY (A) CLEAR   Specific Gravity, Urine 1.020 1.005 - 1.030   pH 6.5 5.0 - 8.0   Glucose, UA NEGATIVE NEGATIVE mg/dL   Hgb urine dipstick NEGATIVE NEGATIVE   Bilirubin Urine NEGATIVE NEGATIVE   Ketones, ur NEGATIVE NEGATIVE mg/dL   Protein, ur NEGATIVE NEGATIVE mg/dL   Urobilinogen, UA 0.2 0.0 - 1.0 mg/dL   Nitrite NEGATIVE NEGATIVE   Leukocytes, UA SMALL (A) NEGATIVE  Urine microscopic-add on     Status: Abnormal   Collection Time: 02/27/15 11:31 AM  Result Value Ref Range   Squamous Epithelial / LPF MANY (A) RARE   WBC, UA 0-2 <3 WBC/hpf   Bacteria, UA FEW (A) RARE  ABO/Rh     Status: None   Collection Time: 02/27/15 12:10 PM  Result Value Ref Range   ABO/RH(D) A POS   hCG, quantitative, pregnancy     Status: Abnormal   Collection Time: 02/27/15 12:10 PM  Result Value Ref Range   hCG, Beta Chain, Quant, S 51 (H) <5 mIU/mL    IMAGING No results found.  MAU COURSE  ASSESSMENT 1. Elevated serum hCG   HCG level has dropped compared to hCG 7 days ago  at [employer] Likely still dropping from miscarriage, but cannot be certain due to hCG levels drawn in different labs.   PLAN Discharge home in stable condition. Use condoms if pregnancy not desired.     Follow-up Information    Follow up with [facility] In 1 week.   Specialty:  Obstetrics and Gynecology   Why:  recheck pregnancy hormone level   Contact information:   [address] [place] [address] [phone]       Medication List    STOP taking these medications        polyethylene glycol packet  Commonly known as:  MIRALAX /  GLYCOLAX     sucralfate 1 GM/10ML suspension  Commonly known as:  CARAFATE      TAKE these medications        cetirizine 10 MG tablet  Commonly known as:  ZYRTEC  Take 10 mg by mouth at bedtime.     ranitidine 75 MG tablet  Commonly known as:  ZANTAC  Take 75 mg by mouth at bedtime.     triamcinolone 55 MCG/ACT Aero nasal inhaler  Commonly known as:  NASACORT  Place 2 sprays into the nose daily as needed (congestion).       [place], CNM 02/27/2015  2:20 PM

## 2015-02-27 NOTE — Discharge Instructions (Signed)
Your pregnancy hormone level (HCG) was 51 today. On 02/20/15 it was 149. This suggests that this is still dropping from your miscarriage and not a new pregnancy, but we can not rely on HGC levels from two different labs.    Human Chorionic Gonadotropin (hCG) This is a test to confirm and monitor pregnancy or to diagnose trophoblastic disease or germ cell tumors. As early as 10 days after a missed menstrual period (some methods can detect hCG even earlier, at one week after conception) or if your caregiver thinks that your symptoms suggest ectopic pregnancy, a failing pregnancy, trophoblastic disease, or germ cell tumors. hCG is a protein produced in the placenta of a pregnant woman. A pregnancy test is a specific blood or urine test that can detect hCG and confirm pregnancy. This hormone is able to be detected 10 days after a missed menstrual period, the time period when the fertilized egg is implanted in the woman's uterus. With some methods, hCG can be detected even earlier, at one week after conception.  During the early weeks of pregnancy, hCG is important in maintaining function of the corpus luteum (the mass of cells that forms from a mature egg). Production of hCG increases steadily during the first trimester (8-10 weeks), peaking around the 10th week after the last menstrual cycle. Levels then fall slowly during the remainder of the pregnancy. hCG is no longer detectable within a few weeks after delivery. hCG is also produced by some germ cell tumors and increased levels are seen in trophoblastic disease. SAMPLE COLLECTION hCG is commonly detected in urine. The preferred specimen is a random urine sample collected first thing in the morning. hCG can also be measured in blood drawn from a vein in the arm. NORMAL FINDINGS Qualitative:negative in non-pregnant women; positive in pregnancy Quantitative:   Gestation less than 1 week: 5-50 Whole HCG (milli-international units/mL)  Gestation of 2  weeks: 50-500 Whole HCG (milli-international units/mL)  Gestation of 3 weeks: 100-10,000 Whole HCG (milli-international units/mL)  Gestation of 4 weeks: 1,000-30,000 Whole HCG (milli-international units/mL)  Gestation of 5 weeks 3,500-115,000 Whole HCG (milli-international units/mL)  Gestation of 6-8 weeks: 12,000-270,000 Whole HCG (milli-international units/mL)  Gestation of 12 weeks: 15,000-220,000 Whole HCG (milli-international units/mL)  Males and non-pregnant females: less than 5 Whole HCG (milli-international units/mL) Beta subunit: depends on the method and test used Ranges for normal findings may vary among different laboratories and hospitals. You should always check with your doctor after having lab work or other tests done to discuss the meaning of your test results and whether your values are considered within normal limits. MEANING OF TEST  Your caregiver will go over the test results with you and discuss the importance and meaning of your results, as well as treatment options and the need for additional tests if necessary. OBTAINING THE TEST RESULTS It is your responsibility to obtain your test results. Ask the lab or department performing the test when and how you will get your results. Document Released: 11/28/2004 Document Revised: 01/19/2012 Document Reviewed: 01/30/2014 [facility] Patient Information 2015 [employer] [place]. This information is not intended to replace advice given to you by your health care provider. Make sure you discuss any questions you have with your health care provider.

## 2015-02-27 NOTE — Progress Notes (Signed)
Ivonne AndrewV. Smith CNM in to see pt and discuss test results and d/c plan. Written and verbal d/c instructions given and understanding voiced.

## 2015-02-27 NOTE — MAU Note (Signed)
Patient states had miscarriage on 3/22 [facility] and f/u bloodwork, was told pregnancy hormone was still high.  Was to follow up in one week [place].  Patient states feels pregnant again, some nausea, no vaginal bleeding, no discharge, no pain.

## 2015-02-27 NOTE — Progress Notes (Signed)
Patient signed release of information for us to receive [facility], faxed.

## 2015-03-06 ENCOUNTER — Other Ambulatory Visit: Payer: 59

## 2015-03-26 ENCOUNTER — Other Ambulatory Visit: Payer: PRIVATE HEALTH INSURANCE

## 2015-03-26 DIAGNOSIS — O039 Complete or unspecified spontaneous abortion without complication: Secondary | ICD-10-CM

## 2015-03-27 LAB — HCG, QUANTITATIVE, PREGNANCY: HCG, BETA CHAIN, QUANT, S: 9.5 m[IU]/mL

## 2015-09-05 ENCOUNTER — Encounter (HOSPITAL_COMMUNITY): Payer: Self-pay | Admitting: *Deleted

## 2015-09-05 ENCOUNTER — Inpatient Hospital Stay (HOSPITAL_COMMUNITY)
Admission: AD | Admit: 2015-09-05 | Discharge: 2015-09-05 | Disposition: A | Discharge status: Home / Self Care | Payer: 59 | Source: Ambulatory Visit | Attending: Family Medicine | Admitting: Family Medicine

## 2015-09-05 DIAGNOSIS — A499 Bacterial infection, unspecified: Secondary | ICD-10-CM

## 2015-09-05 DIAGNOSIS — N760 Acute vaginitis: Secondary | ICD-10-CM | POA: Insufficient documentation

## 2015-09-05 DIAGNOSIS — N926 Irregular menstruation, unspecified: Secondary | ICD-10-CM | POA: Insufficient documentation

## 2015-09-05 DIAGNOSIS — B9689 Other specified bacterial agents as the cause of diseases classified elsewhere: Secondary | ICD-10-CM

## 2015-09-05 HISTORY — DX: Female infertility, unspecified: N97.9

## 2015-09-05 LAB — POCT PREGNANCY, URINE: Preg Test, Ur: NEGATIVE

## 2015-09-05 LAB — URINALYSIS, ROUTINE W REFLEX MICROSCOPIC
Bilirubin Urine: NEGATIVE
Glucose, UA: NEGATIVE mg/dL
HGB URINE DIPSTICK: NEGATIVE
Ketones, ur: NEGATIVE mg/dL
Leukocytes, UA: NEGATIVE
Nitrite: NEGATIVE
Protein, ur: NEGATIVE mg/dL
SPECIFIC GRAVITY, URINE: 1.02 (ref 1.005–1.030)
Urobilinogen, UA: 0.2 mg/dL (ref 0.0–1.0)
pH: 8 (ref 5.0–8.0)

## 2015-09-05 LAB — WET PREP, GENITAL
Trich, Wet Prep: NONE SEEN
Yeast Wet Prep HPF POC: NONE SEEN

## 2015-09-05 MED ORDER — METRONIDAZOLE 500 MG PO TABS: 500.0000 mg | ORAL_TABLET | Freq: Two times a day (BID) | ORAL | Status: DC

## 2015-09-05 NOTE — MAU Note (Signed)
Patient not in lobby

## 2015-09-05 NOTE — MAU Provider Note (Signed)
History     CSN: [id number]  Arrival date and time: 09/05/15 1154   First Provider Initiated Contact with Patient 09/05/15 1303      No chief complaint on file.  HPI Karen Meyer 29 y.o. [id number] nonpregnant female presents for menstrual irregularities.  Her LMP was 9/11 and no period since then.  Prior to that, she had to use progesterone to get menses to come.  She discontinued Depo 3 years ago.  She denies abdominal pain, fever, weakness, vaginal bleeding.  She does note sore breasts and nausea - and states these are typical for her prior to menses.   OB History    Gravida Para Term Preterm AB TAB SAB Ectopic Multiple Living   2 1 1  0 1 0 1 0 0 1      Past Medical History  Diagnosis Date  . Mild acid reflux   . TMJ syndrome LEFT SIDE  . PONV (postoperative nausea and vomiting)   . Frequency of urination   . Urgency of urination   . Nocturia   . Interstitial cystitis   . Fibromyalgia   . Chronic pruritus   . Migraine headache   . GERD (gastroesophageal reflux disease)   . Infertility, female     Past Surgical History  Procedure Laterality Date  . Cysto/ hod/ instillation clorpactin  03-10-2011;  MAR 2011;  2009    FOR I.C.  . Cesarean section  2007  . Cysto with hydrodistension  08/16/2012    Procedure: CYSTOSCOPY/HYDRODISTENSION;  Surgeon: Valetta Fulleravid S Grapey, MD;  [facility];  Service: Urology;  Laterality: N/A;    Family History  Problem Relation Age of Onset  . Diabetes Mother   . Cancer Mother [id number]    2003; BREAST  . Diverticulosis Mother   . Hyperlipidemia Father     Social History  Substance Use Topics  . Smoking status: Never Smoker   . Smokeless tobacco: Never Used  . Alcohol Use: No     Comment: RARE    Allergies:  Allergies  Allergen Reactions  . Cymbalta [Duloxetine Hcl] Shortness Of Breath  . Darvocet [Propoxyphene N-Acetaminophen] Hives    Prescriptions prior to admission  Medication Sig Dispense Refill  Last Dose  . cetirizine (ZYRTEC) 10 MG tablet Take 10 mg by mouth at bedtime.    02/26/2015 at Unknown time  . ranitidine (ZANTAC) 75 MG tablet Take 75 mg by mouth at bedtime.   02/26/2015 at Unknown time  . triamcinolone (NASACORT) 55 MCG/ACT AERO nasal inhaler Place 2 sprays into the nose daily as needed (congestion).   Past Week at Unknown time    ROS Pertinent ROS in HPI.  All other systems are negative.   Physical Exam   Blood pressure 107/77, pulse 79, temperature 98.2 F (36.8 C), resp. rate 18, last menstrual period 07/22/2015.  Physical Exam  Constitutional: She is oriented to person, place, and time. She appears well-developed and well-nourished. No distress.  HENT:  Head: Normocephalic and atraumatic.  Eyes: Conjunctivae and EOM are normal.  Neck: Normal range of motion.  Cardiovascular: Normal rate and normal heart sounds.   Respiratory: Effort normal and breath sounds normal. No respiratory distress.  GI: Soft. She exhibits no distension. There is no tenderness.  Genitourinary:  Large amt of thick white malodorous discharge. Normal cervical contour No CMT No adnexal mass or tenderness  Musculoskeletal: Normal range of motion.  Neurological: She is alert and oriented to person, place, and time.  Skin:  Skin is warm and dry.  Psychiatric: She has a normal mood and affect.   Results for orders placed or performed during the hospital encounter of 09/05/15 (from the past 24 hour(s))  Urinalysis, Routine w reflex microscopic (not at [facility])     Status: Abnormal   Collection Time: 09/05/15 12:33 PM  Result Value Ref Range   Color, Urine YELLOW YELLOW   APPearance HAZY (A) CLEAR   Specific Gravity, Urine 1.020 1.005 - 1.030   pH 8.0 5.0 - 8.0   Glucose, UA NEGATIVE NEGATIVE mg/dL   Hgb urine dipstick NEGATIVE NEGATIVE   Bilirubin Urine NEGATIVE NEGATIVE   Ketones, ur NEGATIVE NEGATIVE mg/dL   Protein, ur NEGATIVE NEGATIVE mg/dL   Urobilinogen, UA 0.2 0.0 - 1.0 mg/dL    Nitrite NEGATIVE NEGATIVE   Leukocytes, UA NEGATIVE NEGATIVE  Pregnancy, urine POC     Status: None   Collection Time: 09/05/15 12:33 PM  Result Value Ref Range   Preg Test, Ur NEGATIVE NEGATIVE  Wet prep, genital     Status: Abnormal   Collection Time: 09/05/15  1:11 PM  Result Value Ref Range   Yeast Wet Prep HPF POC NONE SEEN NONE SEEN   Trich, Wet Prep NONE SEEN NONE SEEN   Clue Cells Wet Prep HPF POC MANY (A) NONE SEEN   WBC, Wet Prep HPF POC FEW (A) NONE SEEN    MAU Course  Procedures  MDM BV noted on wet prep No further eval warranted emergently  Assessment and Plan  A:  1. Bacterial vaginosis   2. Irregular menstrual cycle    P: Discharge to home Begin care with a gynecologist for eval of menstrual irregularities Flagyl x 1 week No etoh/IC x 7 days Patient may return to MAU as needed or if her condition were to change or worsen   Bertram Denver 09/05/2015, 1:05 PM

## 2015-09-05 NOTE — Discharge Instructions (Signed)

## 2015-09-05 NOTE — MAU Note (Signed)
C/o N&V and sore breasts for past 2 weeks; had been on Depo for about 7 yrs and she came off the Depo around 3 yrs ago and is having trouble regulating her periods and getting pregnant; took some med to encourage ovulation for 3 months but did not want to keep taking those meds due to nausea; has been seeing her PCP and gets the feeling that he does not really know what he is doing;

## 2015-09-05 NOTE — MAU Note (Signed)
Pt presents to MAU with complaints of not having a cycle since September the 11th. Reports sore breast. Negative pregnancy test last week.

## 2015-09-06 LAB — GC/CHLAMYDIA PROBE AMP (~~LOC~~) NOT AT ARMC
CHLAMYDIA, DNA PROBE: NEGATIVE
NEISSERIA GONORRHEA: NEGATIVE

## 2015-09-22 IMAGING — US US PELVIS COMPLETE
1 series · 14 of 25 positions shown · non-contrast
Comparison: None

CLINICAL DATA: Dysmenorrhea

EXAM:
TRANSABDOMINAL AND TRANSVAGINAL ULTRASOUND OF PELVIS
TECHNIQUE: Both transabdominal and transvaginal ultrasound examinations of the
pelvis were performed. Transabdominal technique was performed for
global imaging of the pelvis including uterus, ovaries, adnexal
regions, and pelvic cul-de-sac. It was necessary to proceed with
endovaginal exam following the transabdominal exam to visualize the
uterus and ovaries.

[Series 1: us pelvis complete · 14 of 38 slices shown]
[im 1/38]
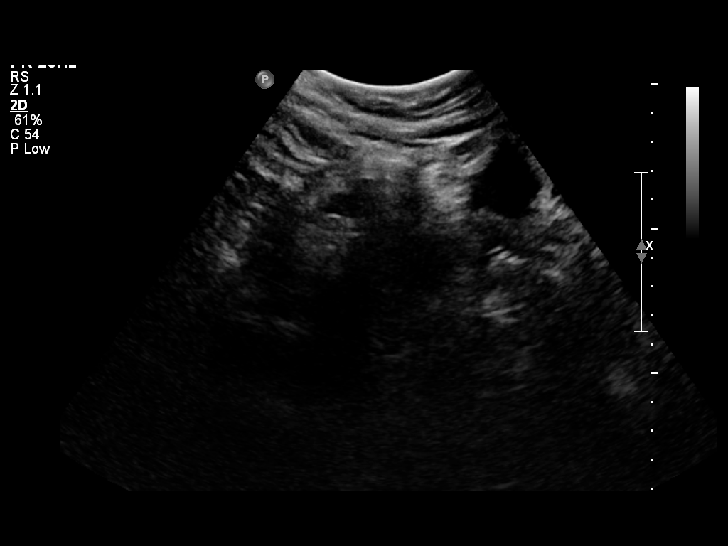
[im 4/38]
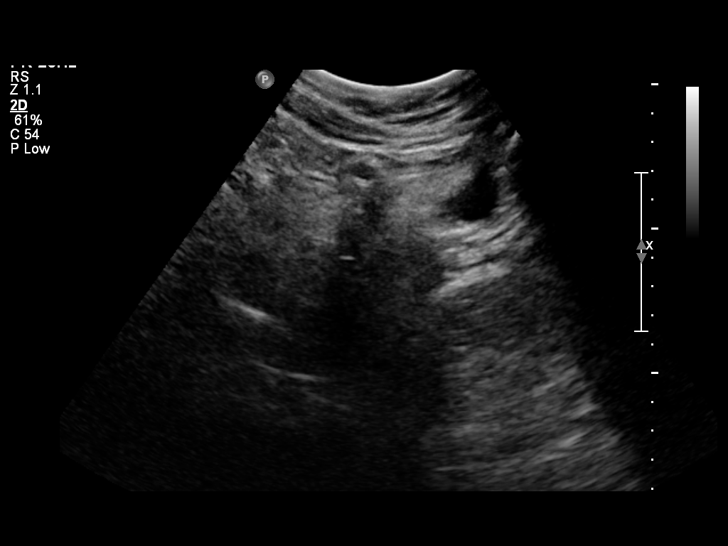
[im 7/38]
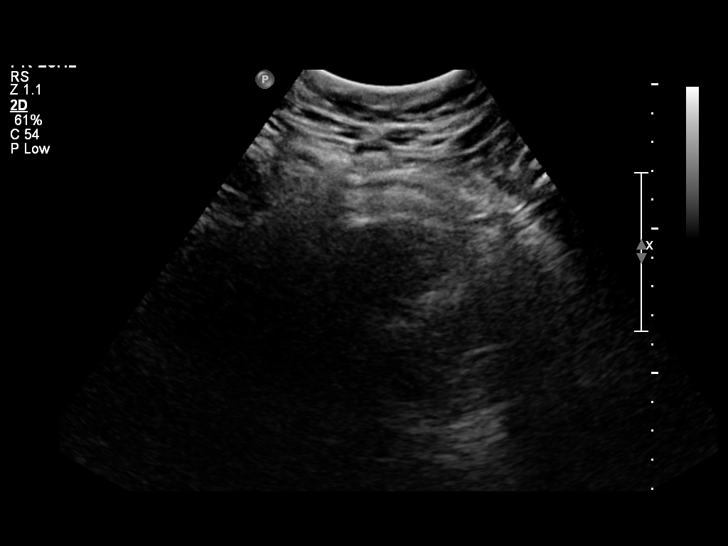
[im 10/38]
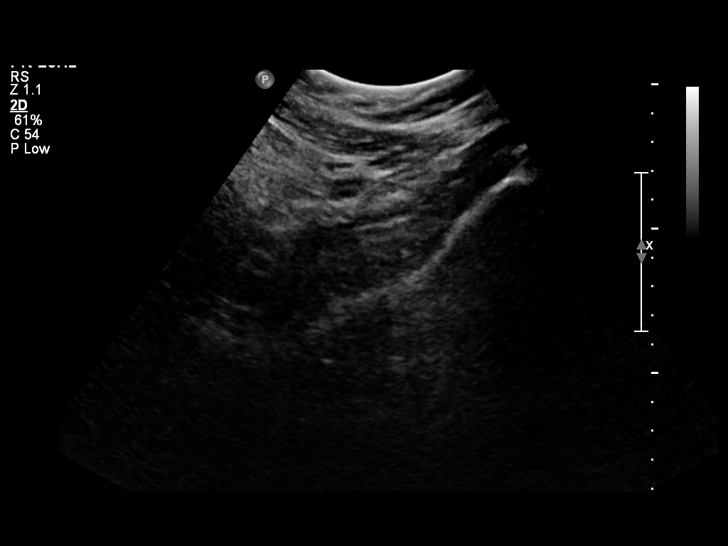
[im 13/38]
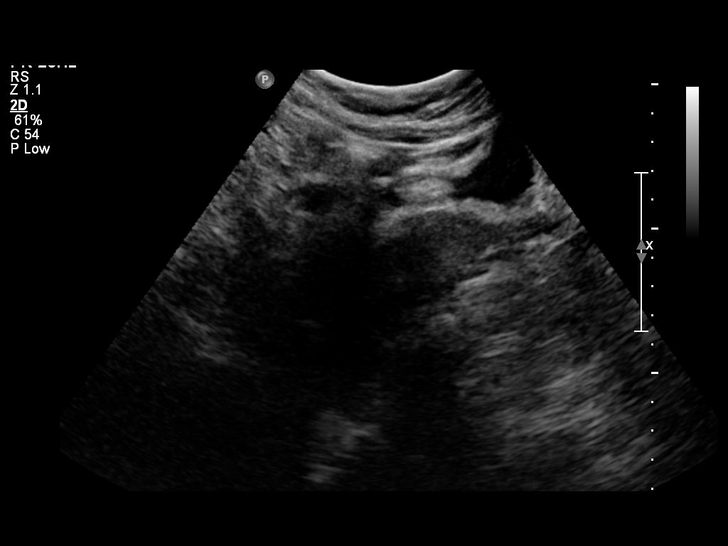
[im 14/38]
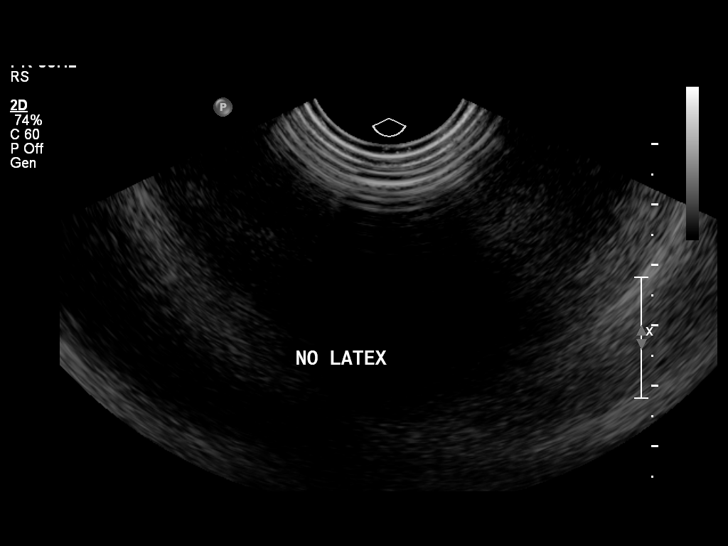
[im 17/38]
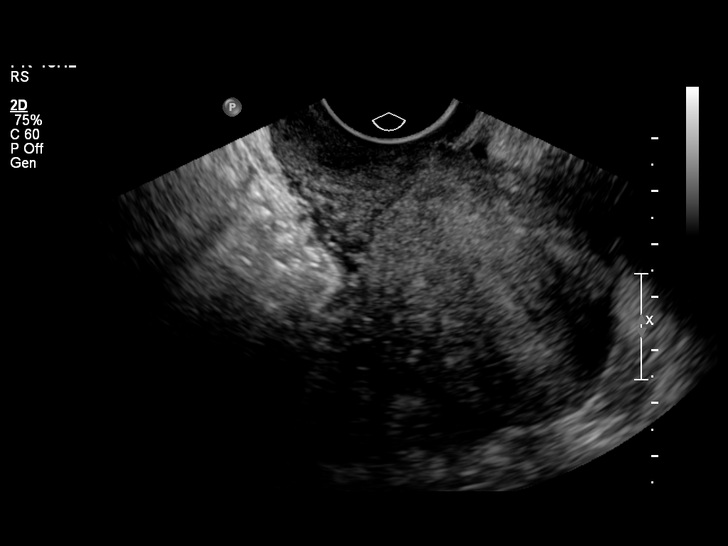
[im 21/38]
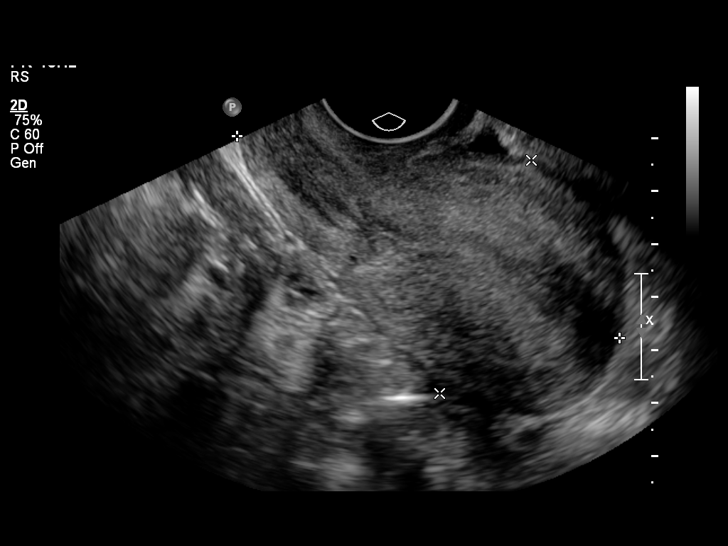
[im 24/38]
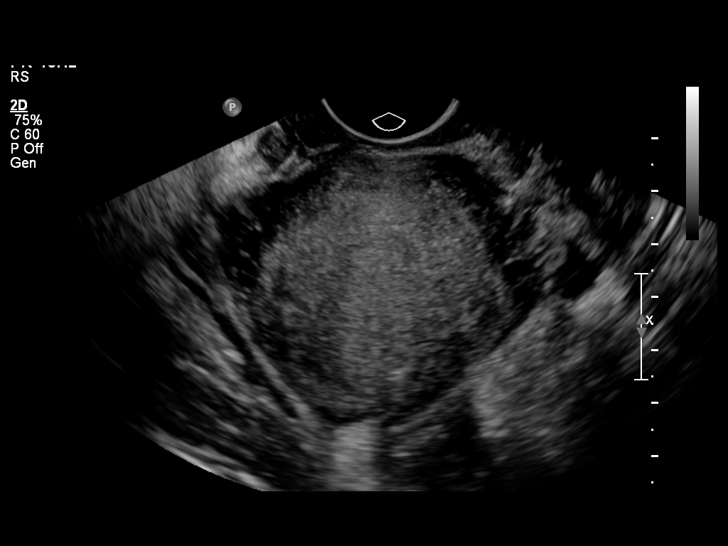
[im 25/38]
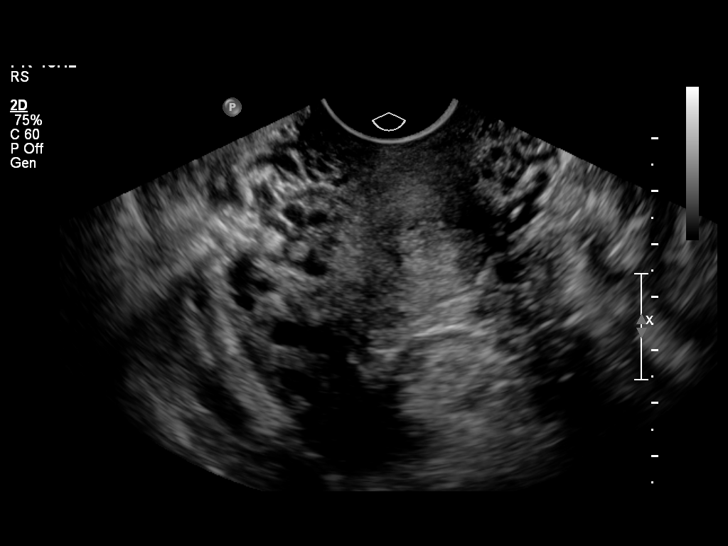
[im 28/38]
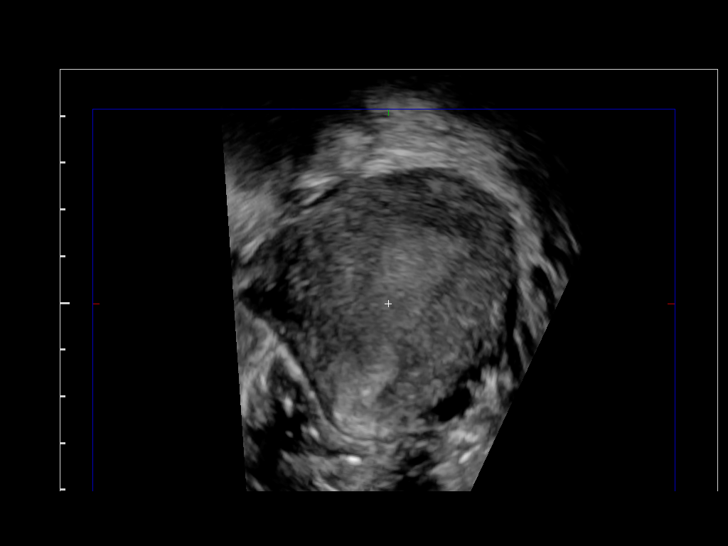
[im 31/38]
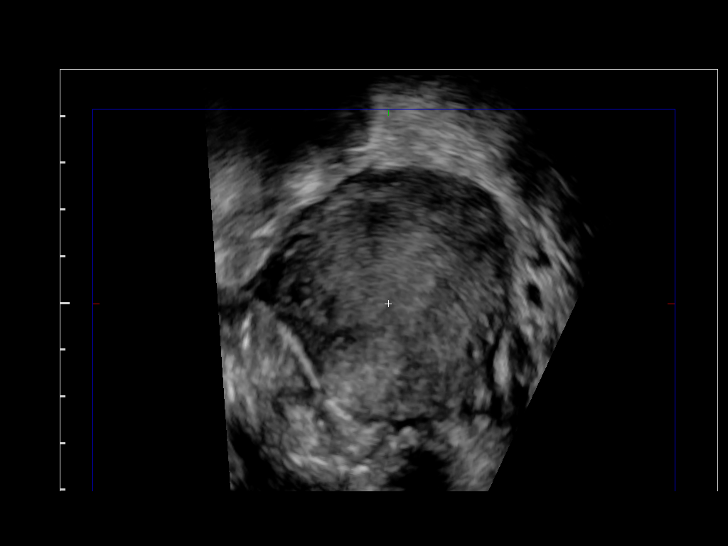
[im 34/38]
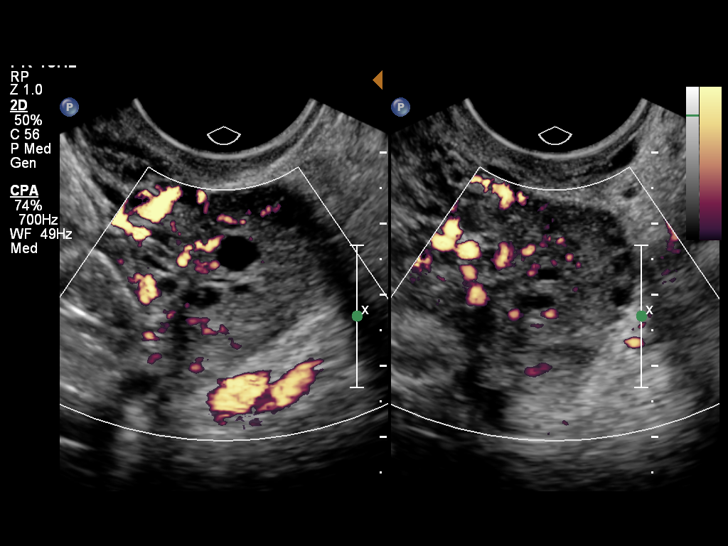
[im 38/38]
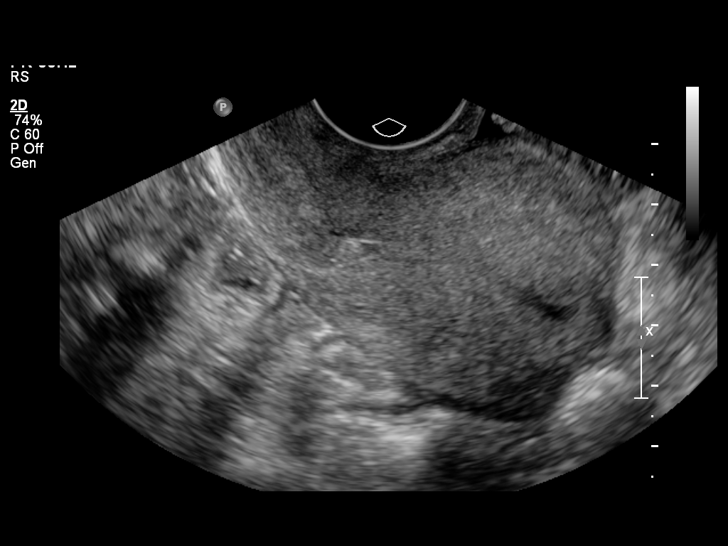

[14 of 25 positions shown; findings below may reference images not displayed]

FINDINGS: Uterus

Measurements: 8.2 x 4.8 x 5.4 cm.. No fibroids or other mass
visualized.

Endometrium

Thickness: 8 mm.  No focal abnormality visualized.

Right ovary

Measurements: 3.9 x 2.0 x 1.6 cm.. Normal appearance/no adnexal
mass.

Left ovary

Measurements: 2.9 x 2.1 x 1.8 cm.. Normal appearance/no adnexal
mass.

Other findings

No free fluid.
IMPRESSION: No acute abnormality is identified within the pelvis.

## 2015-11-29 ENCOUNTER — Encounter (HOSPITAL_COMMUNITY): Payer: Self-pay | Admitting: *Deleted

## 2015-11-29 ENCOUNTER — Inpatient Hospital Stay (HOSPITAL_COMMUNITY)
Admission: AD | Admit: 2015-11-29 | Discharge: 2015-11-29 | Disposition: A | Discharge status: Home / Self Care | Payer: Medicaid Other | Source: Ambulatory Visit | Attending: Obstetrics & Gynecology | Admitting: Obstetrics & Gynecology

## 2015-11-29 ENCOUNTER — Inpatient Hospital Stay (HOSPITAL_COMMUNITY): Payer: Medicaid Other

## 2015-11-29 DIAGNOSIS — O3680X0 Pregnancy with inconclusive fetal viability, not applicable or unspecified: Secondary | ICD-10-CM

## 2015-11-29 DIAGNOSIS — K219 Gastro-esophageal reflux disease without esophagitis: Secondary | ICD-10-CM | POA: Diagnosis not present

## 2015-11-29 DIAGNOSIS — N9489 Other specified conditions associated with female genital organs and menstrual cycle: Secondary | ICD-10-CM

## 2015-11-29 DIAGNOSIS — M797 Fibromyalgia: Secondary | ICD-10-CM | POA: Diagnosis not present

## 2015-11-29 DIAGNOSIS — M26602 Left temporomandibular joint disorder, unspecified: Secondary | ICD-10-CM | POA: Diagnosis not present

## 2015-11-29 DIAGNOSIS — Z3A01 Less than 8 weeks gestation of pregnancy: Secondary | ICD-10-CM | POA: Insufficient documentation

## 2015-11-29 DIAGNOSIS — O9989 Other specified diseases and conditions complicating pregnancy, childbirth and the puerperium: Secondary | ICD-10-CM

## 2015-11-29 DIAGNOSIS — R102 Pelvic and perineal pain: Secondary | ICD-10-CM | POA: Diagnosis not present

## 2015-11-29 DIAGNOSIS — O26891 Other specified pregnancy related conditions, first trimester: Secondary | ICD-10-CM

## 2015-11-29 DIAGNOSIS — R109 Unspecified abdominal pain: Secondary | ICD-10-CM | POA: Diagnosis present

## 2015-11-29 DIAGNOSIS — R110 Nausea: Secondary | ICD-10-CM | POA: Diagnosis present

## 2015-11-29 DIAGNOSIS — M549 Dorsalgia, unspecified: Secondary | ICD-10-CM

## 2015-11-29 DIAGNOSIS — O99891 Other specified diseases and conditions complicating pregnancy: Secondary | ICD-10-CM

## 2015-11-29 LAB — URINALYSIS, ROUTINE W REFLEX MICROSCOPIC
BILIRUBIN URINE: NEGATIVE
GLUCOSE, UA: NEGATIVE mg/dL
Hgb urine dipstick: NEGATIVE
KETONES UR: NEGATIVE mg/dL
Leukocytes, UA: NEGATIVE
NITRITE: NEGATIVE
PH: 6 (ref 5.0–8.0)
PROTEIN: NEGATIVE mg/dL
Specific Gravity, Urine: >1.03 — ABNORMAL HIGH (ref 1.005–1.030)

## 2015-11-29 LAB — CBC
HCT: 36.3 % (ref 36.0–46.0)
HCT: 36.1 % (ref 36.0–46.0)
Hemoglobin: 12.5 g/dL (ref 12.0–15.0)
Hemoglobin: 12.4 g/dL (ref 12.0–15.0)
MCH: 30.9 pg (ref 26.0–34.0)
MCH: 30.7 pg (ref 26.0–34.0)
MCHC: 34.4 g/dL (ref 30.0–36.0)
MCHC: 34.3 g/dL (ref 30.0–36.0)
MCV: 89.9 fL (ref 78.0–100.0)
MCV: 89.4 fL (ref 78.0–100.0)
PLATELETS: 243 10*3/uL (ref 150–400)
Platelets: 246 10*3/uL (ref 150–400)
RBC: 4.04 MIL/uL (ref 3.87–5.11)
RBC: 4.04 MIL/uL (ref 3.87–5.11)
RDW: 12.7 % (ref 11.5–15.5)
RDW: 12.6 % (ref 11.5–15.5)
WBC: 3.8 10*3/uL — ABNORMAL LOW (ref 4.0–10.5)
WBC: 3.8 10*3/uL — AB (ref 4.0–10.5)

## 2015-11-29 LAB — WET PREP, GENITAL
Clue Cells Wet Prep HPF POC: NONE SEEN
Sperm: NONE SEEN
Trich, Wet Prep: NONE SEEN
Yeast Wet Prep HPF POC: NONE SEEN

## 2015-11-29 LAB — GC/CHLAMYDIA PROBE AMP (~~LOC~~) NOT AT ARMC
CHLAMYDIA, DNA PROBE: NEGATIVE
NEISSERIA GONORRHEA: NEGATIVE

## 2015-11-29 LAB — HIV ANTIBODY (ROUTINE TESTING W REFLEX): HIV SCREEN 4TH GENERATION: NONREACTIVE

## 2015-11-29 LAB — POCT PREGNANCY, URINE: Preg Test, Ur: POSITIVE — AB

## 2015-11-29 LAB — RPR: RPR: NONREACTIVE

## 2015-11-29 LAB — HCG, QUANTITATIVE, PREGNANCY: HCG, BETA CHAIN, QUANT, S: 19 m[IU]/mL — AB (ref <5)

## 2015-11-29 MED ORDER — ACETAMINOPHEN 500 MG PO TABS
1000.0000 mg | ORAL_TABLET | Freq: Once | ORAL | Status: AC
  Administered 2015-11-29: 1000 mg via ORAL
  Filled 2015-11-29: qty 2

## 2015-11-29 NOTE — MAU Provider Note (Signed)
Chief Complaint  Patient presents with  . Abdominal Pain  l Low back pain   HPI Comments: Karen Meyer is a 30 y.o. G2P1011 at 5 weeks 0 days who presents today with low back pain and low abdominal cramping. She denies any vaginal bleeding. She states that her LMP was 10/25/15.  [place] this morning showed no IUP possible small lesion suspicious for ectopic pregnancy Abdominal Pain  The current episode started in the past 7 days. The onset quality is gradual. The problem occurs constantly. The problem has been unchanged. The pain is located in the suprapubic region and leftlow back. The pain is at a severity of 4/10. The quality of the pain is cramping. The abdominal pain radiates to the back. Associated symptoms include nausea. Pertinent negatives include no constipation, diarrhea, dysuria, fever, frequency or vomiting. Nothing aggravates the pain. The pain is relieved by nothing. She has tried nothing for the symptoms.    This is a highly desired pregnancy.   Past Medical History  Diagnosis Date  . Mild acid reflux   . TMJ syndrome LEFT SIDE  . PONV (postoperative nausea and vomiting)   . Frequency of urination   . Urgency of urination   . Nocturia   . Interstitial cystitis   . Fibromyalgia   . Chronic pruritus   . Migraine headache   . GERD (gastroesophageal reflux disease)   . Infertility, female     Past Surgical History  Procedure Laterality Date  . Cysto/ hod/ instillation clorpactin  03-10-2011; MAR 2011; 2009    FOR I.C.  . Cesarean section  2007  . Cysto with hydrodistension  08/16/2012    Procedure: CYSTOSCOPY/HYDRODISTENSION; Surgeon: Valetta Fuller, MD; Location: [facility]; Service: Urology; Laterality: N/A;    Family History  Problem Relation Age of Onset  . Diabetes Mother   . Cancer Mother 49    2003; BREAST  . Diverticulosis Mother   .  Hyperlipidemia Father     Social History  Substance Use Topics  . Smoking status: Never Smoker   . Smokeless tobacco: Never Used  . Alcohol Use: No     Comment: RARE    Allergies:  Allergies  Allergen Reactions  . Cymbalta [Duloxetine Hcl] Shortness Of Breath  . Darvocet [Propoxyphene N-Acetaminophen] Hives    Prescriptions prior to admission  Medication Sig Dispense Refill Last Dose  . cetirizine (ZYRTEC) 10 MG tablet Take 10 mg by mouth at bedtime.    09/04/2015 at Unknown time  . metroNIDAZOLE (FLAGYL) 500 MG tablet Take 1 tablet (500 mg total) by mouth 2 (two) times daily. 14 tablet 0   . ranitidine (ZANTAC) 75 MG tablet Take 75 mg by mouth at bedtime.   09/04/2015 at Unknown time    Lab Results Last 48 Hours    Results for orders placed or performed during the hospital encounter of 11/29/15 (from the past 48 hour(s))  Urinalysis, Routine w reflex microscopic (not at [facility]) Status: Abnormal   Collection Time: 11/29/15 7:35 AM  Result Value Ref Range   Color, Urine YELLOW YELLOW   APPearance CLEAR CLEAR   Specific Gravity, Urine >1.030 (H) 1.005 - 1.030   pH 6.0 5.0 - 8.0   Glucose, UA NEGATIVE NEGATIVE mg/dL   Hgb urine dipstick NEGATIVE NEGATIVE   Bilirubin Urine NEGATIVE NEGATIVE   Ketones, ur NEGATIVE NEGATIVE mg/dL   Protein, ur NEGATIVE NEGATIVE mg/dL   Nitrite NEGATIVE NEGATIVE   Leukocytes, UA NEGATIVE NEGATIVE  Comment: MICROSCOPIC NOT DONE ON URINES WITH NEGATIVE PROTEIN, BLOOD, LEUKOCYTES, NITRITE, OR GLUCOSE <1000 mg/dL.  Pregnancy, urine POC Status: Abnormal   Collection Time: 11/29/15 7:48 AM  Result Value Ref Range   Preg Test, Ur POSITIVE (A) NEGATIVE    Comment:   THE SENSITIVITY OF THIS METHODOLOGY IS >24 mIU/mL   Wet prep, genital Status: Abnormal   Collection Time: 11/29/15 8:05 AM  Result  Value Ref Range   Yeast Wet Prep HPF POC NONE SEEN NONE SEEN   Trich, Wet Prep NONE SEEN NONE SEEN   Clue Cells Wet Prep HPF POC NONE SEEN NONE SEEN   WBC, Wet Prep HPF POC MODERATE (A) NONE SEEN    Comment: FEW BACTERIA SEEN   Sperm NONE SEEN   CBC Status: Abnormal   Collection Time: 11/29/15 8:10 AM  Result Value Ref Range   WBC 3.8 (L) 4.0 - 10.5 K/uL   RBC 4.04 3.87 - 5.11 MIL/uL   Hemoglobin 12.5 12.0 - 15.0 g/dL   HCT 10.9 60.4 - 54.0 %   MCV 89.9 78.0 - 100.0 fL   MCH 30.9 26.0 - 34.0 pg   MCHC 34.4 30.0 - 36.0 g/dL   RDW 98.1 19.1 - 47.8 %   Platelets 243 150 - 400 K/uL  hCG, quantitative, pregnancy Status: Abnormal   Collection Time: 11/29/15 8:10 AM  Result Value Ref Range   hCG, Beta Chain, Quant, S 19 (H) <5 mIU/mL    Comment:    GEST. AGE CONC. (mIU/mL)  <=1 WEEK 5 - 50  2 WEEKS 50 - 500  3 WEEKS 100 - 10,000  4 WEEKS 1,000 - 30,000  5 WEEKS 3,500 - 115,000  6-8 WEEKS 12,000 - 270,000  12 WEEKS 15,000 - 220,000   FEMALE AND NON-PREGNANT FEMALE:  LESS THAN 5 mIU/mL       Lab Results Last 48 Hours    Results for orders placed or performed during the hospital encounter of 11/29/15 (from the past 48 hour(s))  Urinalysis, Routine w reflex microscopic (not at [facility]) Status: Abnormal   Collection Time: 11/29/15 7:35 AM  Result Value Ref Range   Color, Urine YELLOW YELLOW   APPearance CLEAR CLEAR   Specific Gravity, Urine >1.030 (H) 1.005 - 1.030   pH 6.0 5.0 - 8.0   Glucose, UA NEGATIVE NEGATIVE mg/dL   Hgb urine dipstick NEGATIVE NEGATIVE   Bilirubin Urine NEGATIVE NEGATIVE   Ketones, ur NEGATIVE NEGATIVE mg/dL   Protein, ur NEGATIVE NEGATIVE mg/dL   Nitrite NEGATIVE NEGATIVE   Leukocytes, UA NEGATIVE NEGATIVE    Comment: MICROSCOPIC NOT  DONE ON URINES WITH NEGATIVE PROTEIN, BLOOD, LEUKOCYTES, NITRITE, OR GLUCOSE <1000 mg/dL.  Pregnancy, urine POC Status: Abnormal   Collection Time: 11/29/15 7:48 AM  Result Value Ref Range   Preg Test, Ur POSITIVE (A) NEGATIVE    Comment:   THE SENSITIVITY OF THIS METHODOLOGY IS >24 mIU/mL   Wet prep, genital Status: Abnormal   Collection Time: 11/29/15 8:05 AM  Result Value Ref Range   Yeast Wet Prep HPF POC NONE SEEN NONE SEEN   Trich, Wet Prep NONE SEEN NONE SEEN   Clue Cells Wet Prep HPF POC NONE SEEN NONE SEEN   WBC, Wet Prep HPF POC MODERATE (A) NONE SEEN    Comment: FEW BACTERIA SEEN   Sperm NONE SEEN   CBC Status: Abnormal   Collection Time: 11/29/15 8:10 AM  Result Value Ref Range   WBC 3.8 (L) 4.0 - 10.5  K/uL   RBC 4.04 3.87 - 5.11 MIL/uL   Hemoglobin 12.5 12.0 - 15.0 g/dL   HCT 40.9 81.1 - 91.4 %   MCV 89.9 78.0 - 100.0 fL   MCH 30.9 26.0 - 34.0 pg   MCHC 34.4 30.0 - 36.0 g/dL   RDW 78.2 95.6 - 21.3 %   Platelets 243 150 - 400 K/uL  hCG, quantitative, pregnancy Status: Abnormal   Collection Time: 11/29/15 8:10 AM  Result Value Ref Range   hCG, Beta Chain, Quant, S 19 (H) <5 mIU/mL    Comment:    GEST. AGE CONC. (mIU/mL)  <=1 WEEK 5 - 50  2 WEEKS 50 - 500  3 WEEKS 100 - 10,000  4 WEEKS 1,000 - 30,000  5 WEEKS 3,500 - 115,000  6-8 WEEKS 12,000 - 270,000  12 WEEKS 15,000 - 220,000   FEMALE AND NON-PREGNANT FEMALE:  LESS THAN 5 mIU/mL       Imaging Results (Last 48 hours)    US Ob Comp Less 14 Wks  11/29/2015 CLINICAL DATA: Pelvic pain. Suprapubic cramping for 47 days. Quantitative beta HCG is pending. LMP 10/25/2015. Gravida 3 para 1 SAB 1. Previous C-section and multiple bladder surgeries. By LMP patient is 5 weeks 0 days. EDC by LMP is 07/31/2016. EXAM:  OBSTETRIC <14 WK [place] AND TRANSVAGINAL OB US TECHNIQUE: Both transabdominal and transvaginal ultrasound examinations were performed for complete evaluation of the gestation as well as the maternal uterus, adnexal regions, and pelvic cul-de-sac. Transvaginal technique was performed to assess early pregnancy. COMPARISON: Pelvic ultrasound 02/20/2015 FINDINGS: Intrauterine gestational sac: None seen Yolk sac: None seen Embryo: None seen Cardiac Activity: None Heart Rate: Not applicable bpm Subchorionic hemorrhage: None visualized. Maternal uterus/adnexae: The right ovary has a normal appearance. Probable left corpus luteum cyst. A small solid mass is identified adjacent to but separate from the left ovary measuring 1.0 x 0.7 x 0.7 cm. There is a trace free pelvic fluid. IMPRESSION: 1. No evidence for intrauterine pregnancy. 2. Suspect left adnexal pregnancy, measuring 1.0 cm. Critical Value/emergent results were called by telephone at the time of interpretation on 11/29/2015 at 9:18 am to Dr. Venia Carbon, who verbally acknowledged these results. Electronically Signed By: Norva Pavlov M.D. On: 11/29/2015 09:23   US Ob Transvaginal  11/29/2015 CLINICAL DATA: Pelvic pain. Suprapubic cramping for 47 days. Quantitative beta HCG is pending. LMP 10/25/2015. Gravida 3 para 1 SAB 1. Previous C-section and multiple bladder surgeries. By LMP patient is 5 weeks 0 days. EDC by LMP is 07/31/2016. EXAM: OBSTETRIC <14 WK [place] AND TRANSVAGINAL OB US TECHNIQUE: Both transabdominal and transvaginal ultrasound examinations were performed for complete evaluation of the gestation as well as the maternal uterus, adnexal regions, and pelvic cul-de-sac. Transvaginal technique was performed to assess early pregnancy. COMPARISON: Pelvic ultrasound 02/20/2015 FINDINGS: Intrauterine gestational sac: None seen Yolk sac: None seen Embryo: None seen Cardiac Activity: None Heart Rate: Not applicable bpm Subchorionic hemorrhage:  None visualized. Maternal uterus/adnexae: The right ovary has a normal appearance. Probable left corpus luteum cyst. A small solid mass is identified adjacent to but separate from the left ovary measuring 1.0 x 0.7 x 0.7 cm. There is a trace free pelvic fluid. IMPRESSION: 1. No evidence for intrauterine pregnancy. 2. Suspect left adnexal pregnancy, measuring 1.0 cm. Critical Value/emergent results were called by telephone at the time of interpretation on 11/29/2015 at 9:18 am to Dr. Venia Carbon, who verbally acknowledged these results. Electronically Signed By: Norva Pavlov M.D. On: 11/29/2015 09:23  Review of Systems  Constitutional: Negative for fever and chills.  Gastrointestinal: Positive for nausea and abdominal pain. Negative for vomiting, diarrhea and constipation.  Genitourinary: Negative for dysuria, urgency and frequency.   Physical Exam   Blood pressure 114/75, pulse 73, temperature 98.3 F (36.8 C), resp. rate 18, last menstrual period 10/25/2015.  Physical Exam  Nursing note and vitals reviewed. Constitutional: She is oriented to person, place, and time. She appears well-developed and well-nourished. No distress.  HENT:  Head: Normocephalic.  Cardiovascular: Normal rate.  Respiratory: Effort normal.  GI: Soft. There is no tenderness. There is no rebound. Pelvic deferred Neurological: She is alert and oriented to person, place, and time.  Skin: Skin is warm and dry.  Psychiatric: She has a normal mood and affect.    MAU Course  Procedures  None  MDM 0800 Care turned over to J. Rasch, NP- patient in [place].  Tawnya Crook  8:05 AM 11/29/2015    Discussed [place] with radiologist.  Discussed Quant, HPI, physical exam and [place] results with Dr. Debroah Loop in detail. The patient is stable at this time; [name] is 17, highly desired pregnancy. Ok to bring the patient back in 48 hours for repeat quant. Will emphasize strict ectopic precautions with the  patient.  Abdominal exam: soft, non-tender. No rebound, no guarding.  A positive blood type   Assessment and Plan   A:  1. Pregnancy of unknown anatomic location   2. Pelvic pain affecting pregnancy in first trimester, antepartum   3. Adnexal mass, low HCG cannot rule out ectopic pregnancy but exam is not suspicious for rupture    P:  Discharge home in stable condition Return to MAU with ANY increase in abdominal pain/ bleeding Pelvic rest Strict ectopic precautions Return to MAU in 2 days for repeat quant or sooner if symptoms worsen May have Tylenol for pain, does not want anything stronger        Adam Phenix, MD 11/29/2015 6:11 PM

## 2015-11-29 NOTE — Discharge Instructions (Signed)
Ectopic Pregnancy °An ectopic pregnancy happens when a fertilized egg grows outside the uterus. A pregnancy cannot live outside of the uterus. This problem often happens in the fallopian tube. It is often caused by damage to the fallopian tube. °If this problem is found early, you may be treated with medicine. If your tube tears or bursts open (ruptures), you will bleed inside. This is an emergency. You will need surgery. Get help right away.  °SYMPTOMS °You may have normal pregnancy symptoms at first. These include: °· Missing your period. °· Feeling sick to your stomach (nauseous). °· Being tired. °· Having tender breasts. °Then, you may start to have symptoms that are not normal. These include: °· Pain with sex (intercourse). °· Bleeding from the vagina. This includes light bleeding (spotting). °· Belly (abdomen) or lower belly cramping or pain. This may be felt on one side. °· A fast heartbeat (pulse). °· Passing out (fainting) after going poop (bowel movement). °If your tube tears, you may have symptoms such as: °· Really bad pain in the belly or lower belly. This happens suddenly. °· Dizziness. °· Passing out. °· Shoulder pain. °GET HELP RIGHT AWAY IF:  °You have any of these symptoms. This is an emergency. °MAKE SURE YOU: °· Understand these instructions. °· Will watch your condition. °· Will get help right away if you are not doing well or get worse. °  °This information is not intended to replace advice given to you by your health care provider. Make sure you discuss any questions you have with your health care provider. °  °Document Released: 01/23/2009 Document Revised: 11/01/2013 Document Reviewed: 06/08/2013 °Elsevier Interactive Patient Education ©2016 Elsevier Inc. ° °

## 2015-11-29 NOTE — MAU Provider Note (Signed)
History     CSN: [id number]  Arrival date and time: 11/29/15 7829   First Provider Initiated Contact with Patient 11/29/15 [phone]      Chief Complaint  Patient presents with  . Abdominal Pain  . Nausea   HPI Comments: Karen Meyer is a 30 y.o. G2P1011 at 5 weeks 0 days  who presents today with cramping. She denies any vaginal bleeding. She states that her LMP was 10/25/15.   Abdominal Pain This is a new problem. The current episode started in the past 7 days. The onset quality is gradual. The problem occurs constantly. The problem has been unchanged. The pain is located in the suprapubic region. The pain is at a severity of 4/10. The quality of the pain is cramping. The abdominal pain radiates to the back. Associated symptoms include nausea. Pertinent negatives include no constipation, diarrhea, dysuria, fever, frequency or vomiting. Nothing aggravates the pain. The pain is relieved by nothing. She has tried nothing for the symptoms.    This is a highly desired pregnancy.   Past Medical History  Diagnosis Date  . Mild acid reflux   . TMJ syndrome LEFT SIDE  . PONV (postoperative nausea and vomiting)   . Frequency of urination   . Urgency of urination   . Nocturia   . Interstitial cystitis   . Fibromyalgia   . Chronic pruritus   . Migraine headache   . GERD (gastroesophageal reflux disease)   . Infertility, female     Past Surgical History  Procedure Laterality Date  . Cysto/ hod/ instillation clorpactin  03-10-2011;  MAR 2011;  2009    FOR [facility]  . Cesarean section  2007  . Cysto with hydrodistension  08/16/2012    Procedure: CYSTOSCOPY/HYDRODISTENSION;  Surgeon: Valetta Fuller, MD;  Location: [facility];  Service: Urology;  Laterality: N/A;    Family History  Problem Relation Age of Onset  . Diabetes Mother   . Cancer Mother 30    2003; BREAST  . Diverticulosis Mother   . Hyperlipidemia Father     Social History  Substance Use Topics  .  Smoking status: Never Smoker   . Smokeless tobacco: Never Used  . Alcohol Use: No     Comment: RARE    Allergies:  Allergies  Allergen Reactions  . Cymbalta [Duloxetine Hcl] Shortness Of Breath  . Darvocet [Propoxyphene N-Acetaminophen] Hives    Prescriptions prior to admission  Medication Sig Dispense Refill Last Dose  . cetirizine (ZYRTEC) 10 MG tablet Take 10 mg by mouth at bedtime.    09/04/2015 at Unknown time  . metroNIDAZOLE (FLAGYL) 500 MG tablet Take 1 tablet (500 mg total) by mouth 2 (two) times daily. 14 tablet 0   . ranitidine (ZANTAC) 75 MG tablet Take 75 mg by mouth at bedtime.   09/04/2015 at Unknown time   Results for orders placed or performed during the hospital encounter of 11/29/15 (from the past 48 hour(s))  Urinalysis, Routine w reflex microscopic (not at [facility])     Status: Abnormal   Collection Time: 11/29/15  7:35 AM  Result Value Ref Range   Color, Urine YELLOW YELLOW   APPearance CLEAR CLEAR   Specific Gravity, Urine >1.030 (H) 1.005 - 1.030   pH 6.0 5.0 - 8.0   Glucose, UA NEGATIVE NEGATIVE mg/dL   Hgb urine dipstick NEGATIVE NEGATIVE   Bilirubin Urine NEGATIVE NEGATIVE   Ketones, ur NEGATIVE NEGATIVE mg/dL   Protein, ur NEGATIVE NEGATIVE mg/dL  Nitrite NEGATIVE NEGATIVE   Leukocytes, UA NEGATIVE NEGATIVE    Comment: MICROSCOPIC NOT DONE ON URINES WITH NEGATIVE PROTEIN, BLOOD, LEUKOCYTES, NITRITE, OR GLUCOSE <1000 mg/dL.  Pregnancy, urine POC     Status: Abnormal   Collection Time: 11/29/15  7:48 AM  Result Value Ref Range   Preg Test, Ur POSITIVE (A) NEGATIVE    Comment:        THE SENSITIVITY OF THIS METHODOLOGY IS >24 mIU/mL   Wet prep, genital     Status: Abnormal   Collection Time: 11/29/15  8:05 AM  Result Value Ref Range   Yeast Wet Prep HPF POC NONE SEEN NONE SEEN   Trich, Wet Prep NONE SEEN NONE SEEN   Clue Cells Wet Prep HPF POC NONE SEEN NONE SEEN   WBC, Wet Prep HPF POC MODERATE (A) NONE SEEN    Comment: FEW BACTERIA SEEN    Sperm NONE SEEN   CBC     Status: Abnormal   Collection Time: 11/29/15  8:10 AM  Result Value Ref Range   WBC 3.8 (L) 4.0 - 10.5 K/uL   RBC 4.04 3.87 - 5.11 MIL/uL   Hemoglobin 12.5 12.0 - 15.0 g/dL   HCT 16.1 09.6 - 04.5 %   MCV 89.9 78.0 - 100.0 fL   MCH 30.9 26.0 - 34.0 pg   MCHC 34.4 30.0 - 36.0 g/dL   RDW 40.9 81.1 - 91.4 %   Platelets 243 150 - 400 K/uL  hCG, quantitative, pregnancy     Status: Abnormal   Collection Time: 11/29/15  8:10 AM  Result Value Ref Range   hCG, Beta Chain, Quant, S 19 (H) <5 mIU/mL    Comment:          GEST. AGE      CONC.  (mIU/mL)   <=1 WEEK        5 - 50     2 WEEKS       50 - 500     3 WEEKS       100 - 10,000     4 WEEKS     1,000 - 30,000     5 WEEKS     3,500 - 115,000   6-8 WEEKS     12,000 - 270,000    12 WEEKS     15,000 - 220,000        FEMALE AND NON-PREGNANT FEMALE:     LESS THAN 5 mIU/mL    Results for orders placed or performed during the hospital encounter of 11/29/15 (from the past 48 hour(s))  Urinalysis, Routine w reflex microscopic (not at [facility])     Status: Abnormal   Collection Time: 11/29/15  7:35 AM  Result Value Ref Range   Color, Urine YELLOW YELLOW   APPearance CLEAR CLEAR   Specific Gravity, Urine >1.030 (H) 1.005 - 1.030   pH 6.0 5.0 - 8.0   Glucose, UA NEGATIVE NEGATIVE mg/dL   Hgb urine dipstick NEGATIVE NEGATIVE   Bilirubin Urine NEGATIVE NEGATIVE   Ketones, ur NEGATIVE NEGATIVE mg/dL   Protein, ur NEGATIVE NEGATIVE mg/dL   Nitrite NEGATIVE NEGATIVE   Leukocytes, UA NEGATIVE NEGATIVE    Comment: MICROSCOPIC NOT DONE ON URINES WITH NEGATIVE PROTEIN, BLOOD, LEUKOCYTES, NITRITE, OR GLUCOSE <1000 mg/dL.  Pregnancy, urine POC     Status: Abnormal   Collection Time: 11/29/15  7:48 AM  Result Value Ref Range   Preg Test, Ur POSITIVE (A) NEGATIVE    Comment:  THE SENSITIVITY OF THIS METHODOLOGY IS >24 mIU/mL   Wet prep, genital     Status: Abnormal   Collection Time: 11/29/15  8:05 AM  Result Value Ref  Range   Yeast Wet Prep HPF POC NONE SEEN NONE SEEN   Trich, Wet Prep NONE SEEN NONE SEEN   Clue Cells Wet Prep HPF POC NONE SEEN NONE SEEN   WBC, Wet Prep HPF POC MODERATE (A) NONE SEEN    Comment: FEW BACTERIA SEEN   Sperm NONE SEEN   CBC     Status: Abnormal   Collection Time: 11/29/15  8:10 AM  Result Value Ref Range   WBC 3.8 (L) 4.0 - 10.5 K/uL   RBC 4.04 3.87 - 5.11 MIL/uL   Hemoglobin 12.5 12.0 - 15.0 g/dL   HCT 46.9 62.9 - 52.8 %   MCV 89.9 78.0 - 100.0 fL   MCH 30.9 26.0 - 34.0 pg   MCHC 34.4 30.0 - 36.0 g/dL   RDW 41.3 24.4 - 01.0 %   Platelets 243 150 - 400 K/uL  hCG, quantitative, pregnancy     Status: Abnormal   Collection Time: 11/29/15  8:10 AM  Result Value Ref Range   hCG, Beta Chain, Quant, S 19 (H) <5 mIU/mL    Comment:          GEST. AGE      CONC.  (mIU/mL)   <=1 WEEK        5 - 50     2 WEEKS       50 - 500     3 WEEKS       100 - 10,000     4 WEEKS     1,000 - 30,000     5 WEEKS     3,500 - 115,000   6-8 WEEKS     12,000 - 270,000    12 WEEKS     15,000 - 220,000        FEMALE AND NON-PREGNANT FEMALE:     LESS THAN 5 mIU/mL    US Ob Comp Less 14 Wks  11/29/2015  CLINICAL DATA:  Pelvic pain. Suprapubic cramping for 47 days. Quantitative beta HCG is pending. LMP 10/25/2015. Gravida 3 para 1 SAB 1. Previous C-section and multiple bladder surgeries. By LMP patient is 5 weeks 0 days. EDC by LMP is 07/31/2016. EXAM: OBSTETRIC <14 WK [place] AND TRANSVAGINAL OB US TECHNIQUE: Both transabdominal and transvaginal ultrasound examinations were performed for complete evaluation of the gestation as well as the maternal uterus, adnexal regions, and pelvic cul-de-sac. Transvaginal technique was performed to assess early pregnancy. COMPARISON:  Pelvic ultrasound 02/20/2015 FINDINGS: Intrauterine gestational sac: None seen Yolk sac:  None seen Embryo:  None seen Cardiac Activity: None Heart Rate: Not applicable  bpm Subchorionic hemorrhage:  None visualized. Maternal  uterus/adnexae: The right ovary has a normal appearance. Probable left corpus luteum cyst. A small solid mass is identified adjacent to but separate from the left ovary measuring 1.0 x 0.7 x 0.7 cm. There is a trace free pelvic fluid. IMPRESSION: 1. No evidence for intrauterine pregnancy. 2. Suspect left adnexal pregnancy, measuring 1.0 cm. Critical Value/emergent results were called by telephone at the time of interpretation on 11/29/2015 at 9:18 am to Dr. Venia Carbon, who verbally acknowledged these results. Electronically Signed   By: Norva Pavlov M.D.   On: 11/29/2015 09:23   US Ob Transvaginal  11/29/2015  CLINICAL DATA:  Pelvic pain. Suprapubic cramping for 47 days. Quantitative  beta HCG is pending. LMP 10/25/2015. Gravida 3 para 1 SAB 1. Previous C-section and multiple bladder surgeries. By LMP patient is 5 weeks 0 days. EDC by LMP is 07/31/2016. EXAM: OBSTETRIC <14 WK [place] AND TRANSVAGINAL OB US TECHNIQUE: Both transabdominal and transvaginal ultrasound examinations were performed for complete evaluation of the gestation as well as the maternal uterus, adnexal regions, and pelvic cul-de-sac. Transvaginal technique was performed to assess early pregnancy. COMPARISON:  Pelvic ultrasound 02/20/2015 FINDINGS: Intrauterine gestational sac: None seen Yolk sac:  None seen Embryo:  None seen Cardiac Activity: None Heart Rate: Not applicable  bpm Subchorionic hemorrhage:  None visualized. Maternal uterus/adnexae: The right ovary has a normal appearance. Probable left corpus luteum cyst. A small solid mass is identified adjacent to but separate from the left ovary measuring 1.0 x 0.7 x 0.7 cm. There is a trace free pelvic fluid. IMPRESSION: 1. No evidence for intrauterine pregnancy. 2. Suspect left adnexal pregnancy, measuring 1.0 cm. Critical Value/emergent results were called by telephone at the time of interpretation on 11/29/2015 at 9:18 am to Dr. Venia Carbon, who verbally acknowledged these results.  Electronically Signed   By: Norva Pavlov M.D.   On: 11/29/2015 09:23    Review of Systems  Constitutional: Negative for fever and chills.  Gastrointestinal: Positive for nausea and abdominal pain. Negative for vomiting, diarrhea and constipation.  Genitourinary: Negative for dysuria, urgency and frequency.   Physical Exam   Blood pressure 114/67, pulse 74, temperature 98.2 F (36.8 C), resp. rate 18, height  (1.626 m), weight 163 lb (73.936 kg), last menstrual period 10/25/2015.  Physical Exam  Nursing note and vitals reviewed. Constitutional: She is oriented to person, place, and time. She appears well-developed and well-nourished. No distress.  HENT:  Head: Normocephalic.  Cardiovascular: Normal rate.   Respiratory: Effort normal.  GI: Soft. There is no tenderness. There is no rebound.  Neurological: She is alert and oriented to person, place, and time.  Skin: Skin is warm and dry.  Psychiatric: She has a normal mood and affect.    MAU Course  Procedures  None  MDM 0800 Care turned over to J. Rasch, NP- patient in [place].  Tawnya Crook  8:05 AM 11/29/2015    Discussed [place] with radiologist.  Discussed Quant, HPI, physical exam and [place] results with Dr. Debroah Loop in detail. The patient is stable at this time; [name] is 17, highly desired pregnancy. Ok to bring the patient back in 48 hours for repeat quant. Will emphasize strict ectopic precautions with the patient.  Abdominal exam: soft, non-tender. No rebound, no guarding.  A positive blood type   Assessment and Plan   A:  1. Pregnancy of unknown anatomic location   2. Pelvic pain affecting pregnancy in first trimester, antepartum   3. Adnexal mass     P:  Discharge home in stable condition Return to MAU with ANY increase in abdominal pain/ bleeding Pelvic rest Strict ectopic precautions Return to MAU in 2 days for repeat quant or sooner if symptoms worsen Support given      Duane Lope,  NP 11/29/2015 10:35 AM

## 2015-11-29 NOTE — MAU Note (Signed)
Pt here earlier. Stated abd much worse this afternoon.

## 2015-11-29 NOTE — Discharge Instructions (Signed)

## 2015-11-29 NOTE — MAU Note (Signed)
Pt reports she had a positive HPT. C/O abd and back pain x 4 days. Denies vag bleeding or discharge.

## 2015-12-01 ENCOUNTER — Inpatient Hospital Stay (HOSPITAL_COMMUNITY)
Admission: AD | Admit: 2015-12-01 | Discharge: 2015-12-01 | Disposition: A | Discharge status: Home / Self Care | Payer: Medicaid Other | Source: Ambulatory Visit | Attending: Obstetrics & Gynecology | Admitting: Obstetrics & Gynecology

## 2015-12-01 DIAGNOSIS — Z3A01 Less than 8 weeks gestation of pregnancy: Secondary | ICD-10-CM | POA: Insufficient documentation

## 2015-12-01 DIAGNOSIS — O3680X0 Pregnancy with inconclusive fetal viability, not applicable or unspecified: Secondary | ICD-10-CM

## 2015-12-01 DIAGNOSIS — M797 Fibromyalgia: Secondary | ICD-10-CM | POA: Insufficient documentation

## 2015-12-01 DIAGNOSIS — O009 Unspecified ectopic pregnancy without intrauterine pregnancy: Secondary | ICD-10-CM | POA: Diagnosis present

## 2015-12-01 DIAGNOSIS — K219 Gastro-esophageal reflux disease without esophagitis: Secondary | ICD-10-CM | POA: Diagnosis not present

## 2015-12-01 DIAGNOSIS — O001 Tubal pregnancy without intrauterine pregnancy: Secondary | ICD-10-CM

## 2015-12-01 DIAGNOSIS — O26891 Other specified pregnancy related conditions, first trimester: Secondary | ICD-10-CM | POA: Diagnosis not present

## 2015-12-01 DIAGNOSIS — R109 Unspecified abdominal pain: Secondary | ICD-10-CM | POA: Diagnosis present

## 2015-12-01 LAB — CREATININE, SERUM: Creatinine, Ser: 0.78 mg/dL (ref 0.44–1.00)

## 2015-12-01 LAB — CBC WITH DIFFERENTIAL/PLATELET
Basophils Absolute: 0 10*3/uL (ref 0.0–0.1)
Basophils Relative: 0 %
EOS ABS: 0 10*3/uL (ref 0.0–0.7)
EOS PCT: 0 %
HCT: 38.4 % (ref 36.0–46.0)
Hemoglobin: 13.5 g/dL (ref 12.0–15.0)
LYMPHS ABS: 1.3 10*3/uL (ref 0.7–4.0)
Lymphocytes Relative: 24 %
MCH: 31.5 pg (ref 26.0–34.0)
MCHC: 35.2 g/dL (ref 30.0–36.0)
MCV: 89.5 fL (ref 78.0–100.0)
MONO ABS: 0.3 10*3/uL (ref 0.1–1.0)
Monocytes Relative: 5 %
Neutro Abs: 3.7 10*3/uL (ref 1.7–7.7)
Neutrophils Relative %: 71 %
PLATELETS: 275 10*3/uL (ref 150–400)
RBC: 4.29 MIL/uL (ref 3.87–5.11)
RDW: 12.8 % (ref 11.5–15.5)
WBC: 5.2 10*3/uL (ref 4.0–10.5)

## 2015-12-01 LAB — AST: AST: 21 U/L (ref 15–41)

## 2015-12-01 LAB — BUN: BUN: 17 mg/dL (ref 6–20)

## 2015-12-01 LAB — HCG, QUANTITATIVE, PREGNANCY: hCG, Beta Chain, Quant, S: 49 m[IU]/mL — ABNORMAL HIGH (ref <5)

## 2015-12-01 MED ORDER — METHOTREXATE INJECTION FOR WOMEN'S HOSPITAL
50.0000 mg/m2 | Freq: Once | INTRAMUSCULAR | Status: AC
  Administered 2015-12-01: 90 mg via INTRAMUSCULAR
  Filled 2015-12-01: qty 1.8

## 2015-12-01 NOTE — MAU Note (Signed)
Back pain rates 4/10 wants to discuss Methotrexate, explained to patient Dr. Macon Large has been contacted to come and speak with her but may be awhile because in surgery.

## 2015-12-01 NOTE — MAU Provider Note (Signed)
Chief Complaint  Patient presents with  . Ectopic Pregnancy    Subjective:   Pt is a 30 y.o. G3P1011 here for follow-up BHCG.  Upon review of the records patient was first seen on 11-29-15 for abdominal pain.   BHCG on that day was 19.  Ultrasound showed no IUP and a suspicious mass adjacent to the left ovary.  GC/CT and wet prep were collected.  Results were negative.   Pt discharged home 11-29-15.   Pt here today with abdominal pain that continues to be the same but no  vaginal bleeding.   All other systems negative.    Past Medical History  Diagnosis Date  . Mild acid reflux   . TMJ syndrome LEFT SIDE  . PONV (postoperative nausea and vomiting)   . Frequency of urination   . Urgency of urination   . Nocturia   . Interstitial cystitis   . Fibromyalgia   . Chronic pruritus   . Migraine headache   . GERD (gastroesophageal reflux disease)   . Infertility, female     OB History  Gravida Para Term Preterm AB SAB TAB Ectopic Multiple Living  0 1 1 0 0 0 1    # Outcome Date GA Lbr Len/2nd Weight Sex Delivery Anes PTL Lv  3 Current           2 SAB           1 Term               Family History  Problem Relation Age of Onset  . Diabetes Mother   . Cancer Mother 13    2003; BREAST  . Diverticulosis Mother   . Hyperlipidemia Father     Objective: Physical Exam  Filed Vitals:   12/01/15 0913  BP: 117/74  Pulse: 76  Temp: 98.3 F (36.8 C)  Resp: 16   Constitutional: She is oriented to person, place, and time. She appears well-developed and well-nourished. No distress.  Pulmonary/Chest: Effort normal. No respiratory distress.  Musculoskeletal: Normal range of motion.  Neurological: She is alert and oriented to person, place, and time.  Skin: Skin is warm and dry.   Consult by phone with Dr. Macon Large to review the plan of care  Results for orders placed or performed during the hospital encounter of 12/01/15 (from the past 24 hour(s))  hCG, quantitative, pregnancy      Status: Abnormal   Collection Time: 12/01/15  9:17 AM  Result Value Ref Range   hCG, Beta Chain, Quant, S 49 (H) <5 mIU/mL    Assessment: 30 y.o. G3P1011 at [redacted]w[redacted]d wks Pregnancy with pregnancy in unknown location and suspicious 1 cm mass adjacent to left ovary on ultrasound Follow-up BHCG 49 BHCG increasing at expected rate for normal pregnancy  Plan: Reviewed with client the lab and ultrasound findings from her previous visit and from today's labs. Client's pain is the same as on her previous visit - no worse Client desires to continue to follow this pregnancy and does not want methotrexate today. Agrees to come at 8 am on Monday to have lab done in the clinic.  Sticker added to the notebook for the clinic. To return to MAU at any time her pain worsens or she has vaginal bleeding. Continue ectopic precautions.

## 2015-12-01 NOTE — Discharge Instructions (Signed)
Methotrexate Treatment for an Ectopic Pregnancy, Care After °Refer to this sheet in the next few weeks. These instructions provide you with information on caring for yourself after your procedure. Your health care provider may also give you more specific instructions. Your treatment has been planned according to current medical practices, but problems sometimes occur. Call your health care provider if you have any problems or questions after your procedure. °WHAT TO EXPECT AFTER THE PROCEDURE °You may have some abdominal cramping, vaginal bleeding, and fatigue in the first few days after taking methotrexate. Some other possible side effects of methotrexate include: °· Nausea. °· Vomiting. °· Diarrhea. °· Mouth sores. °· Swelling or irritation of the lining of your lungs (pneumonitis). °· Liver damage. °· Hair loss. °HOME CARE INSTRUCTIONS  °After you have received the methotrexate medicine, you need to be careful of your activities and watch your condition for several weeks. It may take 1 week before your hormone levels return to normal. °· Keep all follow-up appointments as directed by your health care provider. °· Avoid traveling too far away from your health care provider. °· Do not have sexual intercourse until your health care provider says it is safe to do so. °· You may resume your usual diet. °· Limit strenuous activity. °· Do not take folic acid, prenatal vitamins, or other vitamins that contain folic acid. °· Do not take aspirin, ibuprofen, or naproxen (nonsteroidal anti-inflammatory drugs [NSAIDs]). °· Do not drink alcohol. °SEEK MEDICAL CARE IF:  °· You cannot control your nausea and vomiting. °· You cannot control your diarrhea. °· You have sores in your mouth and want treatment. °· You need pain medicine for your abdominal pain. °· You have a rash. °· You are having a reaction to the medicine. °SEEK IMMEDIATE MEDICAL CARE IF:  °· You have increasing abdominal or pelvic pain. °· You notice increased  bleeding. °· You feel light-headed, or you faint. °· You have shortness of breath. °· Your heart rate increases. °· You have a cough. °· You have chills. °· You have a fever. °  °This information is not intended to replace advice given to you by your health care provider. Make sure you discuss any questions you have with your health care provider. °  °Document Released: 10/16/2011 Document Revised: 11/01/2013 Document Reviewed: 08/15/2013 °Elsevier Interactive Patient Education ©2016 Elsevier Inc. ° °

## 2015-12-01 NOTE — MAU Note (Signed)
Patient given hand out on ectopic pregnancy and Methotrexate treatment.

## 2015-12-01 NOTE — MAU Note (Signed)
Pt called to give d/c instructions after waiting after MTX shot. Not in lobby and not outside MAU door. Had been instructed to wait until about 2030 to leave. Was instructed to return Tues for repeat lab work or sooner for severe abd pain and/or heavy vag bleeding.

## 2015-12-01 NOTE — MAU Note (Signed)
Patient presents today for repeat HCG having back pain and abdominal pain rates 6/10 today the same as before, denies vaginal bleeding

## 2015-12-01 NOTE — MAU Provider Note (Signed)
Faculty Practice OB/GYN Attending Note  Subjective:  Patient returned today after earlier evaluation; wanted to discuss management of possible ectopic pregnancy. Has talked to FOB.   Objective:  Blood pressure 110/74, pulse 78, temperature 98.3 F (36.8 C), temperature source Oral, resp. rate 18, last menstrual period 10/25/2015. Gen: NAD HENT: Normocephalic, atraumatic Lungs: Normal respiratory effort Heart: Regular rate noted Abdomen: NT gravid fundus, soft Cervix: Deferred Ext: 2+ DTRs, no edema, no cyanosis, negative Homan's sign  Results for orders placed or performed during the hospital encounter of 12/01/15 (from the past 24 hour(s))  hCG, quantitative, pregnancy     Status: Abnormal   Collection Time: 12/01/15  9:17 AM  Result Value Ref Range   hCG, Beta Chain, Quant, S 49 (H) <5 mIU/mL   US Ob Comp Less 14 Wks  11/29/2015  CLINICAL DATA:  Pelvic pain. Suprapubic cramping for 47 days. Quantitative beta HCG is pending. LMP 10/25/2015. Gravida 3 para 1 SAB 1. Previous C-section and multiple bladder surgeries. By LMP patient is 5 weeks 0 days. EDC by LMP is 07/31/2016. EXAM: OBSTETRIC <14 WK [place] AND TRANSVAGINAL OB US TECHNIQUE: Both transabdominal and transvaginal ultrasound examinations were performed for complete evaluation of the gestation as well as the maternal uterus, adnexal regions, and pelvic cul-de-sac. Transvaginal technique was performed to assess early pregnancy. COMPARISON:  Pelvic ultrasound 02/20/2015 FINDINGS: Intrauterine gestational sac: None seen Yolk sac:  None seen Embryo:  None seen Cardiac Activity: None Heart Rate: Not applicable  bpm Subchorionic hemorrhage:  None visualized. Maternal uterus/adnexae: The right ovary has a normal appearance. Probable left corpus luteum cyst. A small solid mass is identified adjacent to but separate from the left ovary measuring 1.0 x 0.7 x 0.7 cm. There is a trace free pelvic fluid. IMPRESSION: 1. No evidence for intrauterine  pregnancy. 2. Suspect left adnexal pregnancy, measuring 1.0 cm. Critical Value/emergent results were called by telephone at the time of interpretation on 11/29/2015 at 9:18 am to Dr. Venia Carbon, who verbally acknowledged these results. Electronically Signed   By: Norva Pavlov M.D.   On: 11/29/2015 09:23   US Ob Transvaginal  11/29/2015  CLINICAL DATA:  Pelvic pain. Suprapubic cramping for 47 days. Quantitative beta HCG is pending. LMP 10/25/2015. Gravida 3 para 1 SAB 1. Previous C-section and multiple bladder surgeries. By LMP patient is 5 weeks 0 days. EDC by LMP is 07/31/2016. EXAM: OBSTETRIC <14 WK [place] AND TRANSVAGINAL OB US TECHNIQUE: Both transabdominal and transvaginal ultrasound examinations were performed for complete evaluation of the gestation as well as the maternal uterus, adnexal regions, and pelvic cul-de-sac. Transvaginal technique was performed to assess early pregnancy. COMPARISON:  Pelvic ultrasound 02/20/2015 FINDINGS: Intrauterine gestational sac: None seen Yolk sac:  None seen Embryo:  None seen Cardiac Activity: None Heart Rate: Not applicable  bpm Subchorionic hemorrhage:  None visualized. Maternal uterus/adnexae: The right ovary has a normal appearance. Probable left corpus luteum cyst. A small solid mass is identified adjacent to but separate from the left ovary measuring 1.0 x 0.7 x 0.7 cm. There is a trace free pelvic fluid. IMPRESSION: 1. No evidence for intrauterine pregnancy. 2. Suspect left adnexal pregnancy, measuring 1.0 cm. Critical Value/emergent results were called by telephone at the time of interpretation on 11/29/2015 at 9:18 am to Dr. Venia Carbon, who verbally acknowledged these results. Electronically Signed   By: Norva Pavlov M.D.   On: 11/29/2015 09:23     Assessment & Plan:  30 y.o. G3P1011 at [redacted]w[redacted]d with possible ectopic  pregnancy.  Discussed expectant management and rechecking HCG on 12/03/15 vs methotrexate therapy vs surgery.  Risks/benefits discussed.   She opts for methotrexate, orders placed.  Will return to clinic for Day 4 and Day 7 labs.  Ectopic precautions given.  Jaynie Collins, MD, FACOG Attending Obstetrician & Gynecologist Faculty Practice, [facility]

## 2015-12-03 ENCOUNTER — Ambulatory Visit: Payer: Medicaid Other | Admitting: Family Medicine

## 2015-12-04 ENCOUNTER — Ambulatory Visit: Payer: Medicaid Other | Admitting: Advanced Practice Midwife

## 2015-12-05 ENCOUNTER — Other Ambulatory Visit: Payer: Medicaid Other

## 2015-12-07 ENCOUNTER — Encounter: Payer: Self-pay | Admitting: Obstetrics and Gynecology

## 2015-12-07 ENCOUNTER — Ambulatory Visit: Payer: Medicaid Other | Admitting: Obstetrics and Gynecology

## 2015-12-07 NOTE — Progress Notes (Signed)
Patient was called about appointment on 12/07/2015, but no answer. A certified letter was sent out today.

## 2015-12-11 ENCOUNTER — Ambulatory Visit (INDEPENDENT_AMBULATORY_CARE_PROVIDER_SITE_OTHER): Payer: Self-pay | Admitting: Advanced Practice Midwife

## 2015-12-11 DIAGNOSIS — O001 Tubal pregnancy without intrauterine pregnancy: Secondary | ICD-10-CM

## 2015-12-11 DIAGNOSIS — O3680X0 Pregnancy with inconclusive fetal viability, not applicable or unspecified: Secondary | ICD-10-CM

## 2015-12-11 LAB — HCG, QUANTITATIVE, PREGNANCY: hCG, Beta Chain, Quant, S: 9 m[IU]/mL — ABNORMAL HIGH (ref <5)

## 2015-12-11 MED ORDER — NORGESTIMATE-ETH ESTRADIOL 0.25-35 MG-MCG PO TABS: 1.0000 | ORAL_TABLET | Freq: Every day | ORAL | Status: DC

## 2015-12-11 MED ORDER — NAPROXEN 375 MG PO TABS: 375.0000 mg | ORAL_TABLET | Freq: Two times a day (BID) | ORAL | Status: DC

## 2015-12-11 NOTE — Patient Instructions (Signed)
Miscarriage  A miscarriage is the sudden loss of an unborn baby (fetus) before the 20th week of pregnancy. Most miscarriages happen in the first 3 months of pregnancy. Sometimes, it happens before a woman even knows she is pregnant. A miscarriage is also called a "spontaneous miscarriage" or "early pregnancy loss." Having a miscarriage can be an emotional experience. Talk with your caregiver about any questions you may have about miscarrying, the grieving process, and your future pregnancy plans.  CAUSES    Problems with the fetal chromosomes that make it impossible for the baby to develop normally. Problems with the baby's genes or chromosomes are most often the result of errors that occur, by chance, as the embryo divides and grows. The problems are not inherited from the parents.   Infection of the cervix or uterus.    Hormone problems.    Problems with the cervix, such as having an incompetent cervix. This is when the tissue in the cervix is not strong enough to hold the pregnancy.    Problems with the uterus, such as an abnormally shaped uterus, uterine fibroids, or congenital abnormalities.    Certain medical conditions.    Smoking, drinking alcohol, or taking illegal drugs.    Trauma.   Often, the cause of a miscarriage is unknown.   SYMPTOMS    Vaginal bleeding or spotting, with or without cramps or pain.   Pain or cramping in the abdomen or lower back.   Passing fluid, tissue, or blood clots from the vagina.  DIAGNOSIS   Your caregiver will perform a physical exam. You may also have an ultrasound to confirm the miscarriage. Blood or urine tests may also be ordered.  TREATMENT    Sometimes, treatment is not necessary if you naturally pass all the fetal tissue that was in the uterus. If some of the fetus or placenta remains in the body (incomplete miscarriage), tissue left behind may become infected and must be removed. Usually, a dilation and curettage (D and C) procedure is performed.  During a D and C procedure, the cervix is widened (dilated) and any remaining fetal or placental tissue is gently removed from the uterus.   Antibiotic medicines are prescribed if there is an infection. Other medicines may be given to reduce the size of the uterus (contract) if there is a lot of bleeding.   If you have Rh negative blood and your baby was Rh positive, you will need a Rh immunoglobulin shot. This shot will protect any future baby from having Rh blood problems in future pregnancies.  HOME CARE INSTRUCTIONS    Your caregiver may order bed rest or may allow you to continue light activity. Resume activity as directed by your caregiver.   Have someone help with home and family responsibilities during this time.    Keep track of the number of sanitary pads you use each day and how soaked (saturated) they are. Write down this information.    Do not use tampons. Do not douche or have sexual intercourse until approved by your caregiver.    Only take over-the-counter or prescription medicines for pain or discomfort as directed by your caregiver.    Do not take aspirin. Aspirin can cause bleeding.    Keep all follow-up appointments with your caregiver.    If you or your partner have problems with grieving, talk to your caregiver or seek counseling to help cope with the pregnancy loss. Allow enough time to grieve before trying to get pregnant again.     SEEK IMMEDIATE MEDICAL CARE IF:    You have severe cramps or pain in your back or abdomen.   You have a fever.   You pass large blood clots (walnut-sized or larger) ortissue from your vagina. Save any tissue for your caregiver to inspect.    Your bleeding increases.    You have a thick, bad-smelling vaginal discharge.   You become lightheaded, weak, or you faint.    You have chills.   MAKE SURE YOU:   Understand these instructions.   Will watch your condition.   Will get help right away if you are not doing well or get worse.     This  information is not intended to replace advice given to you by your health care provider. Make sure you discuss any questions you have with your health care provider.     Document Released: 04/22/2001 Document Revised: 02/21/2013 Document Reviewed: 12/16/2011  Elsevier Interactive Patient Education 2016 [employer]

## 2015-12-11 NOTE — Progress Notes (Signed)
Karen Meyer  is a 30 y.o. G3P1011 at [redacted]w[redacted]d who presents to the [facility] today for follow-up quant hCG after 48 hours. The patient was seen in MAU on 12/01/15 and had quant hCG of 49 and US showed possible left ectopic. She endorses crampy  pain, with no vaginal bleeding or fever today.   OB History  Gravida Para Term Preterm AB SAB TAB Ectopic Multiple Living  0 1 1 0 0 0 1    # Outcome Date GA Lbr Len/2nd Weight Sex Delivery Anes PTL Lv  3 Current           2 SAB           1 Term               Past Medical History  Diagnosis Date  . Mild acid reflux   . TMJ syndrome LEFT SIDE  . PONV (postoperative nausea and vomiting)   . Frequency of urination   . Urgency of urination   . Nocturia   . Interstitial cystitis   . Fibromyalgia   . Chronic pruritus   . Migraine headache   . GERD (gastroesophageal reflux disease)   . Infertility, female      LMP 10/25/2015  CONSTITUTIONAL: Well-developed, well-nourished female in no acute distress.  ENT: External right and left ear normal.  EYES: EOM intact, conjunctivae normal.  MUSCULOSKELETAL: Normal range of motion.  CARDIOVASCULAR: Regular heart rate RESPIRATORY: Normal effort NEUROLOGICAL: Alert and oriented to person, place, and time.  SKIN: Skin is warm and dry. No rash noted. Not diaphoretic. No erythema. No pallor. PSYCH: Normal mood and affect. Normal behavior. Normal judgment and thought content.  Results for orders placed or performed in visit on 12/11/15 (from the past 24 hour(s))  hCG, quantitative, pregnancy     Status: Abnormal   Collection Time: 12/11/15  9:46 AM  Result Value Ref Range   hCG, Beta Chain, Quant, S 9 (H) <5 mIU/mL    A: Ectopic Pregnancy S/P Methotrexate Day #10 Appropriate fall in quant hCG on Day 10  P: Discharge home First ectopic precautions discussed though should be resolving now Discussed with Dr Erin Fulling, no further Quants needed Patient may return to  MAU as needed or if her condition were to change or worsen   Discussed contraception.  Wants to use combination OCPs Rx sent to pharmacy Also sent Rx for Naproxen  Aviva Signs, CNM 12/11/2015 11:05 AM

## 2015-12-16 ENCOUNTER — Emergency Department (HOSPITAL_COMMUNITY): Payer: Medicaid Other

## 2015-12-16 ENCOUNTER — Encounter (HOSPITAL_COMMUNITY): Payer: Self-pay | Admitting: Emergency Medicine

## 2015-12-16 ENCOUNTER — Emergency Department (HOSPITAL_COMMUNITY)
Admission: EM | Admit: 2015-12-16 | Discharge: 2015-12-16 | Disposition: A | Discharge status: Home / Self Care | Payer: Medicaid Other | Attending: Emergency Medicine | Admitting: Emergency Medicine

## 2015-12-16 DIAGNOSIS — R102 Pelvic and perineal pain: Secondary | ICD-10-CM | POA: Diagnosis not present

## 2015-12-16 DIAGNOSIS — Z79899 Other long term (current) drug therapy: Secondary | ICD-10-CM | POA: Insufficient documentation

## 2015-12-16 DIAGNOSIS — Z872 Personal history of diseases of the skin and subcutaneous tissue: Secondary | ICD-10-CM | POA: Insufficient documentation

## 2015-12-16 DIAGNOSIS — Z8739 Personal history of other diseases of the musculoskeletal system and connective tissue: Secondary | ICD-10-CM | POA: Diagnosis not present

## 2015-12-16 DIAGNOSIS — Z8742 Personal history of other diseases of the female genital tract: Secondary | ICD-10-CM | POA: Diagnosis not present

## 2015-12-16 DIAGNOSIS — Z8679 Personal history of other diseases of the circulatory system: Secondary | ICD-10-CM | POA: Diagnosis not present

## 2015-12-16 DIAGNOSIS — K219 Gastro-esophageal reflux disease without esophagitis: Secondary | ICD-10-CM | POA: Diagnosis not present

## 2015-12-16 DIAGNOSIS — R110 Nausea: Secondary | ICD-10-CM | POA: Diagnosis not present

## 2015-12-16 DIAGNOSIS — Z791 Long term (current) use of non-steroidal anti-inflammatories (NSAID): Secondary | ICD-10-CM | POA: Insufficient documentation

## 2015-12-16 DIAGNOSIS — R6883 Chills (without fever): Secondary | ICD-10-CM | POA: Diagnosis present

## 2015-12-16 DIAGNOSIS — Z3202 Encounter for pregnancy test, result negative: Secondary | ICD-10-CM | POA: Diagnosis not present

## 2015-12-16 DIAGNOSIS — Z793 Long term (current) use of hormonal contraceptives: Secondary | ICD-10-CM | POA: Insufficient documentation

## 2015-12-16 LAB — BASIC METABOLIC PANEL
ANION GAP: 10 (ref 5–15)
BUN: 17 mg/dL (ref 6–20)
CALCIUM: 9.2 mg/dL (ref 8.9–10.3)
CO2: 26 mmol/L (ref 22–32)
Chloride: 106 mmol/L (ref 101–111)
Creatinine, Ser: 0.79 mg/dL (ref 0.44–1.00)
GFR calc Af Amer: >60 mL/min (ref >60)
GLUCOSE: 82 mg/dL (ref 65–99)
POTASSIUM: 3.8 mmol/L (ref 3.5–5.1)
SODIUM: 142 mmol/L (ref 135–145)

## 2015-12-16 LAB — CBC WITH DIFFERENTIAL/PLATELET
BASOS ABS: 0 10*3/uL (ref 0.0–0.1)
Basophils Relative: 0 %
EOS ABS: 0 10*3/uL (ref 0.0–0.7)
EOS PCT: 0 %
HCT: 37.2 % (ref 36.0–46.0)
Hemoglobin: 12.9 g/dL (ref 12.0–15.0)
Lymphocytes Relative: 7 %
Lymphs Abs: 0.4 10*3/uL — ABNORMAL LOW (ref 0.7–4.0)
MCH: 31.2 pg (ref 26.0–34.0)
MCHC: 34.7 g/dL (ref 30.0–36.0)
MCV: 90.1 fL (ref 78.0–100.0)
MONO ABS: 0.4 10*3/uL (ref 0.1–1.0)
Monocytes Relative: 7 %
Neutro Abs: 4.3 10*3/uL (ref 1.7–7.7)
Neutrophils Relative %: 86 %
PLATELETS: 228 10*3/uL (ref 150–400)
RBC: 4.13 MIL/uL (ref 3.87–5.11)
RDW: 12.7 % (ref 11.5–15.5)
WBC: 5 10*3/uL (ref 4.0–10.5)

## 2015-12-16 LAB — URINALYSIS, ROUTINE W REFLEX MICROSCOPIC
BILIRUBIN URINE: NEGATIVE
GLUCOSE, UA: 100 mg/dL — AB
HGB URINE DIPSTICK: NEGATIVE
KETONES UR: NEGATIVE mg/dL
Leukocytes, UA: NEGATIVE
Nitrite: NEGATIVE
PROTEIN: NEGATIVE mg/dL
Specific Gravity, Urine: 1.028 (ref 1.005–1.030)
pH: 6.5 (ref 5.0–8.0)

## 2015-12-16 LAB — HCG, QUANTITATIVE, PREGNANCY: hCG, Beta Chain, Quant, S: 2 m[IU]/mL (ref <5)

## 2015-12-16 LAB — I-STAT CG4 LACTIC ACID, ED: LACTIC ACID, VENOUS: 1.15 mmol/L (ref 0.5–2.0)

## 2015-12-16 MED ORDER — ACETAMINOPHEN 325 MG PO TABS
650.0000 mg | ORAL_TABLET | Freq: Once | ORAL | Status: AC
  Administered 2015-12-16: 650 mg via ORAL
  Filled 2015-12-16: qty 2

## 2015-12-16 MED ORDER — ONDANSETRON 4 MG PO TBDP
8.0000 mg | ORAL_TABLET | Freq: Once | ORAL | Status: AC
  Administered 2015-12-16: 8 mg via ORAL
  Filled 2015-12-16: qty 2

## 2015-12-16 MED ORDER — SODIUM CHLORIDE 0.9 % IV SOLN
1000.0000 mL | INTRAVENOUS | Status: DC
  Administered 2015-12-16: 1000 mL via INTRAVENOUS

## 2015-12-16 MED ORDER — ONDANSETRON HCL 4 MG PO TABS: 4.0000 mg | ORAL_TABLET | Freq: Three times a day (TID) | ORAL | Status: DC | PRN

## 2015-12-16 MED ORDER — SODIUM CHLORIDE 0.9 % IV SOLN
1000.0000 mL | Freq: Once | INTRAVENOUS | Status: AC
  Administered 2015-12-16: 1000 mL via INTRAVENOUS

## 2015-12-16 MED ORDER — IBUPROFEN 800 MG PO TABS
800.0000 mg | ORAL_TABLET | Freq: Once | ORAL | Status: AC
  Administered 2015-12-16: 800 mg via ORAL
  Filled 2015-12-16: qty 1

## 2015-12-16 NOTE — ED Notes (Signed)
Pt in reporting lower abd pain, in pelvic region. Pt reports nausea w/o emesis. Hx UTI, hx interstitial cystitis

## 2015-12-16 NOTE — ED Provider Notes (Signed)
CSN: [id number]     Arrival date & time 12/16/15  0459 History   First MD Initiated Contact with Patient 12/16/15 [phone]     Chief Complaint  Patient presents with  . Abdominal Pain  . Chills     (Consider location/radiation/quality/duration/timing/severity/associated sxs/prior Treatment) The history is provided by the patient.  29 year complains of low-grade fever and chills for the last 24 hours. Temperature is been as high as 100. There is associated nausea but no vomiting. She is also complaining of some vague pelvic cramping which she rates at 6/10. She denies any flank pain or dysuria. She denies constipation or diarrhea. She denies arthralgias or myalgias. No cough or sore throat. Of note, she was treated for ectopic pregnancy on January 21 with methotrexate.  Past Medical History  Diagnosis Date  . Mild acid reflux   . TMJ syndrome LEFT SIDE  . PONV (postoperative nausea and vomiting)   . Frequency of urination   . Urgency of urination   . Nocturia   . Interstitial cystitis   . Fibromyalgia   . Chronic pruritus   . Migraine headache   . GERD (gastroesophageal reflux disease)   . Infertility, female    Past Surgical History  Procedure Laterality Date  . Cysto/ hod/ instillation clorpactin  03-10-2011;  MAR 2011;  2009    FOR [facility]  . Cesarean section  2007  . Cysto with hydrodistension  08/16/2012    Procedure: CYSTOSCOPY/HYDRODISTENSION;  Surgeon: Valetta Fuller, MD;  Location: [facility];  Service: Urology;  Laterality: N/A;   Family History  Problem Relation Age of Onset  . Diabetes Mother   . Cancer Mother 51    2003; BREAST  . Diverticulosis Mother   . Hyperlipidemia Father    Social History  Substance Use Topics  . Smoking status: Never Smoker   . Smokeless tobacco: Never Used  . Alcohol Use: No     Comment: RARE   OB History    Gravida Para Term Preterm AB TAB SAB Ectopic Multiple Living   0 1 0 1 0 0 1     Review of Systems   All other systems reviewed and are negative.     Allergies  Cymbalta and Darvocet  Home Medications   Prior to Admission medications   Medication Sig Start Date End Date Taking? Authorizing Provider  cetirizine (ZYRTEC) 10 MG tablet Take 10 mg by mouth at bedtime.     Historical Provider, MD  diphenhydrAMINE (BENADRYL) 25 MG tablet Take 12.5 mg by mouth every 6 (six) hours as needed for itching (hives).    Historical Provider, MD  naproxen (NAPROSYN) 375 MG tablet Take 1 tablet (375 mg total) by mouth 2 (two) times daily with a meal. 12/11/15   Aviva Signs, CNM  norgestimate-ethinyl estradiol (SPRINTEC 28) 0.25-35 MG-MCG tablet Take 1 tablet by mouth daily. 12/11/15   Aviva Signs, CNM  omeprazole (PRILOSEC) 40 MG capsule Take 40 mg by mouth daily.    Historical Provider, MD  ranitidine (ZANTAC) 75 MG tablet Take 75 mg by mouth daily as needed for heartburn.     Historical Provider, MD   BP 125/78 mmHg  Pulse 94  Temp(Src) 99.5 F (37.5 C) (Oral)  Resp 18  SpO2 97%  LMP 10/25/2015 Physical Exam  Nursing note and vitals reviewed.  30 year old female, resting comfortably and in no acute distress. Vital signs are normal. Oxygen saturation is 97%, which  is normal. Head is normocephalic and atraumatic. PERRLA, EOMI. Oropharynx is clear. Neck is nontender and supple without adenopathy or JVD. Back is nontender and there is no CVA tenderness. Lungs are clear without rales, wheezes, or rhonchi. Chest is nontender. Heart has regular rate and rhythm without murmur. Abdomen is soft, flat, nontender without masses or hepatosplenomegaly and peristalsis is normoactive. Pelvic: Normal external female genitalia. Cervix is closed.  Vaginal bleeding. No significant discharge. Fundus is normal size and position and nontender. No adnexal masses, but.moderate bilateral adnexal tenderness. Extremities have no cyanosis or edema, full range of motion is present. Skin is warm and dry without  rash. Neurologic: Mental status is normal, cranial nerves are intact, there are no motor or sensory deficits.  ED Course  Procedures (including critical care time) Labs Review Results for orders placed or performed during the hospital encounter of 12/16/15  Urinalysis, Routine w reflex microscopic  Result Value Ref Range   Color, Urine YELLOW YELLOW   APPearance CLOUDY (A) CLEAR   Specific Gravity, Urine 1.028 1.005 - 1.030   pH 6.5 5.0 - 8.0   Glucose, UA 100 (A) NEGATIVE mg/dL   Hgb urine dipstick NEGATIVE NEGATIVE   Bilirubin Urine NEGATIVE NEGATIVE   Ketones, ur NEGATIVE NEGATIVE mg/dL   Protein, ur NEGATIVE NEGATIVE mg/dL   Nitrite NEGATIVE NEGATIVE   Leukocytes, UA NEGATIVE NEGATIVE  CBC with Differential  Result Value Ref Range   WBC 5.0 4.0 - 10.5 K/uL   RBC 4.13 3.87 - 5.11 MIL/uL   Hemoglobin 12.9 12.0 - 15.0 g/dL   HCT 78.2 95.6 - 21.3 %   MCV 90.1 78.0 - 100.0 fL   MCH 31.2 26.0 - 34.0 pg   MCHC 34.7 30.0 - 36.0 g/dL   RDW 08.6 57.8 - 46.9 %   Platelets 228 150 - 400 K/uL   Neutrophils Relative % 86 %   Neutro Abs 4.3 1.7 - 7.7 K/uL   Lymphocytes Relative 7 %   Lymphs Abs 0.4 (L) 0.7 - 4.0 K/uL   Monocytes Relative 7 %   Monocytes Absolute 0.4 0.1 - 1.0 K/uL   Eosinophils Relative 0 %   Eosinophils Absolute 0.0 0.0 - 0.7 K/uL   Basophils Relative 0 %   Basophils Absolute 0.0 0.0 - 0.1 K/uL  Basic metabolic panel  Result Value Ref Range   Sodium 142 135 - 145 mmol/L   Potassium 3.8 3.5 - 5.1 mmol/L   Chloride 106 101 - 111 mmol/L   CO2 26 22 - 32 mmol/L   Glucose, Bld 82 65 - 99 mg/dL   BUN 17 6 - 20 mg/dL   Creatinine, Ser 6.29 0.44 - 1.00 mg/dL   Calcium 9.2 8.9 - 52.8 mg/dL   GFR calc non Af Amer >60 >60 mL/min   GFR calc Af Amer >60 >60 mL/min   Anion gap 10 5 - 15  hCG, quantitative, pregnancy  Result Value Ref Range   hCG, Beta Chain, Quant, S 2 <5 mIU/mL  I-Stat CG4 Lactic Acid, ED  Result Value Ref Range   Lactic Acid, Venous 1.15 0.5 -  2.0 mmol/L   Imaging Review No results found. I have personally reviewed and evaluated these images and lab results as part of my medical decision-making.   MDM   Final diagnoses:  Pelvic pain in female    Chills and pelvic pain of uncertain cause. This may be nothing more than a viral syndrome. Old records reviewed confirming methotrexate treatment for ectopic pregnancy  on January 21 with follow-up hCG appropriately declined on January 31. Will repeat hCG to make sure there is no issues related to ectopic pregnancy area screening labs will be obtained. Will evaluate with pelvic ultrasound.  Lactic acid level is normal and hCG level is back to normal. Urine is clean-no evidence of urinary tract infection. Pelvic ultrasound still pending. Case is signed out to Dr. Clayborne Dana.  Dione Booze, MD 12/17/15 [phone]

## 2015-12-16 NOTE — ED Notes (Signed)
Pelvic cart setup bedside. 

## 2015-12-16 NOTE — ED Provider Notes (Signed)
7:40 AM Assumed care from Dr. Preston Fleeting, please see their note for full history, physical and decision making until this point. In brief this is a 30 y.o. year old female who presented to the ED tonight with Abdominal Pain and Chills     30 year old female with a recent pregnancy that comes in with pelvic pain and chills for a few hours. Plan is to wait for ultrasound and disposition. Patient is likely here for viral syndrome.   Labs and ultrasound all came back unremarkable. No evidence of urinary tract infection, retained twice conception, ovarian torsion, abscess or other concerning findings. Cannot rule out early appendicitis however on repeated abdominal examination she does not have any evidence of peritonitis or right lower quadrant tenderness. Vital signs have been stable while she is in the emergency department and she also does not have a white blood cell count is elevated, makingthat diagnosis unlikely. I discussed strict return precautions with the patient to return here for any new or worsening symptoms or if not improving in 24 hours.  Discharge instructions, including strict return precautions for new or worsening symptoms, given. Patient and/or family verbalized understanding and agreement with the plan as described.   Labs, studies and imaging reviewed by myself and considered in medical decision making if ordered. Imaging interpreted by radiology.  Labs Reviewed  URINALYSIS, ROUTINE W REFLEX MICROSCOPIC (NOT AT [facility]) - Abnormal; Notable for the following:    APPearance CLOUDY (*)    Glucose, UA 100 (*)    All other components within normal limits  CBC WITH DIFFERENTIAL/PLATELET - Abnormal; Notable for the following:    Lymphs Abs 0.4 (*)    All other components within normal limits  BASIC METABOLIC PANEL  HCG, QUANTITATIVE, PREGNANCY  I-STAT CG4 LACTIC ACID, ED    US Pelvis Complete  Final Result    US Transvaginal Non-OB  Final Result      No Follow-up on  file.   Marily Memos, MD 12/16/15 [phone]

## 2015-12-16 NOTE — ED Notes (Signed)
Pt ambulates to BR. C/o nausea and headache. Wil notify MD.

## 2015-12-16 NOTE — ED Notes (Signed)
Pt off unit with ultrasound 

## 2016-01-01 ENCOUNTER — Encounter (HOSPITAL_BASED_OUTPATIENT_CLINIC_OR_DEPARTMENT_OTHER): Payer: Self-pay | Admitting: Emergency Medicine

## 2016-01-01 DIAGNOSIS — K219 Gastro-esophageal reflux disease without esophagitis: Secondary | ICD-10-CM | POA: Diagnosis not present

## 2016-01-01 DIAGNOSIS — Z87448 Personal history of other diseases of urinary system: Secondary | ICD-10-CM | POA: Insufficient documentation

## 2016-01-01 DIAGNOSIS — Z8679 Personal history of other diseases of the circulatory system: Secondary | ICD-10-CM | POA: Insufficient documentation

## 2016-01-01 DIAGNOSIS — Z8739 Personal history of other diseases of the musculoskeletal system and connective tissue: Secondary | ICD-10-CM | POA: Diagnosis not present

## 2016-01-01 DIAGNOSIS — L02411 Cutaneous abscess of right axilla: Secondary | ICD-10-CM | POA: Insufficient documentation

## 2016-01-01 DIAGNOSIS — Z8742 Personal history of other diseases of the female genital tract: Secondary | ICD-10-CM | POA: Diagnosis not present

## 2016-01-01 DIAGNOSIS — Z872 Personal history of diseases of the skin and subcutaneous tissue: Secondary | ICD-10-CM | POA: Diagnosis not present

## 2016-01-01 DIAGNOSIS — Z79899 Other long term (current) drug therapy: Secondary | ICD-10-CM | POA: Insufficient documentation

## 2016-01-01 NOTE — ED Notes (Signed)
Patient states that she has multiple bumps to her right axilla x 2 -3 days.

## 2016-01-02 ENCOUNTER — Emergency Department (HOSPITAL_BASED_OUTPATIENT_CLINIC_OR_DEPARTMENT_OTHER)
Admission: EM | Admit: 2016-01-02 | Discharge: 2016-01-02 | Disposition: A | Discharge status: Home / Self Care | Payer: Medicaid Other | Attending: Emergency Medicine | Admitting: Emergency Medicine

## 2016-01-02 DIAGNOSIS — L02411 Cutaneous abscess of right axilla: Secondary | ICD-10-CM

## 2016-01-02 MED ORDER — LIDOCAINE-EPINEPHRINE 2 %-1:100000 IJ SOLN
20.0000 mL | Freq: Once | INTRAMUSCULAR | Status: AC
Start: 2016-01-02 — End: 2016-01-02
  Administered 2016-01-02: 20 mL
  Filled 2016-01-02: qty 1

## 2016-01-02 NOTE — ED Notes (Signed)
MD at bedside. 

## 2016-01-02 NOTE — ED Notes (Signed)
Small, gumball sized lump to right axilla, redness and tenderness to area

## 2016-01-02 NOTE — ED Provider Notes (Signed)
CSN: [id number]     Arrival date & time 01/01/16  2304 History   First MD Initiated Contact with Patient 01/02/16 [phone]     Chief Complaint  Patient presents with  . Rash     (Consider location/radiation/quality/duration/timing/severity/associated sxs/prior Treatment) HPI This is a 30 year old female with a four-day history of a tender lump in her right axilla. The onset has been gradual. There is moderate pain, worse with movement or palpation. She denies systemic symptoms such as fever or chills. She has no history of abscesses.  Past Medical History  Diagnosis Date  . Mild acid reflux   . TMJ syndrome LEFT SIDE  . PONV (postoperative nausea and vomiting)   . Frequency of urination   . Urgency of urination   . Nocturia   . Interstitial cystitis   . Fibromyalgia   . Chronic pruritus   . Migraine headache   . GERD (gastroesophageal reflux disease)   . Infertility, female    Past Surgical History  Procedure Laterality Date  . Cysto/ hod/ instillation clorpactin  03-10-2011;  MAR 2011;  2009    FOR [facility].  . Cesarean section  2007  . Cysto with hydrodistension  08/16/2012    Procedure: CYSTOSCOPY/HYDRODISTENSION;  Surgeon: Valetta Fuller, MD;  Location: [facility];  Service: Urology;  Laterality: N/A;   Family History  Problem Relation Age of Onset  . Diabetes Mother   . Cancer Mother 84    2003; BREAST  . Diverticulosis Mother   . Hyperlipidemia Father    Social History  Substance Use Topics  . Smoking status: Never Smoker   . Smokeless tobacco: Never Used  . Alcohol Use: No     Comment: RARE   OB History    Gravida Para Term Preterm AB TAB SAB Ectopic Multiple Living   0 1 0 1 0 0 1     Review of Systems  All other systems reviewed and are negative.   Allergies  Cymbalta and Darvocet  Home Medications   Prior to Admission medications   Medication Sig Start Date End Date Taking? Authorizing Provider  cetirizine (ZYRTEC) 10 MG tablet  Take 30 mg by mouth at bedtime.     Historical Provider, MD  diphenhydrAMINE (BENADRYL) 25 MG tablet Take 12.5 mg by mouth every 6 (six) hours as needed for itching (hives).    Historical Provider, MD  omeprazole (PRILOSEC) 40 MG capsule Take 40 mg by mouth daily.    Historical Provider, MD  ondansetron (ZOFRAN) 4 MG tablet Take 1 tablet (4 mg total) by mouth every 8 (eight) hours as needed for nausea or vomiting. 12/16/15   Marily Memos, MD  ranitidine (ZANTAC) 75 MG tablet Take 75 mg by mouth daily as needed for heartburn.     Historical Provider, MD   BP 108/72 mmHg  Pulse 69  Temp(Src) 98.5 F (36.9 C) (Oral)  Resp 18  Ht  (1.626 m)  Wt 163 lb (73.936 kg)  BMI 27.97 kg/m2  SpO2 99%  LMP 10/30/2015 (Approximate)   Physical Exam General: Well-developed, well-nourished female in no acute distress; appearance consistent with age of record HENT: normocephalic; atraumatic Eyes: pupils equal, round and reactive to light; extraocular muscles intact Neck: supple Heart: regular rate and rhythm Lungs: clear to auscultation bilaterally Abdomen: soft; nondistended; nontender; bowel sounds present Extremities: No deformity; full range of motion; pulses normal Neurologic: Awake, alert and oriented; motor function intact in all extremities and symmetric; no  facial droop Skin: Warm and dry; tender, erythematous mass of right axilla Psychiatric: Normal mood and affect    ED Course  Procedures (including critical care time)  INCISION AND DRAINAGE Performed by: Hanley Seamen Consent: Verbal consent obtained. Risks and benefits: risks, benefits and alternatives were discussed Type: abscess  Body area: Right axilla  Anesthesia: local infiltration  Incision was made with a scalpel.  Local anesthetic: lidocaine 2 % with epinephrine  Anesthetic total: 2 ml  Complexity: complex Blunt dissection to break up loculations  Drainage: purulent  Drainage amount: Small   Packing  material: 1/2 in iodoform gauze  Patient tolerance: Patient tolerated the procedure well with no immediate complications.    MDM     Paula Libra, MD 01/02/16 [phone]

## 2016-01-02 NOTE — ED Notes (Signed)
Pt verbalizes understanding of d/c instructions and denies any further needs at this time. 

## 2016-01-09 ENCOUNTER — Emergency Department (HOSPITAL_BASED_OUTPATIENT_CLINIC_OR_DEPARTMENT_OTHER)
Admission: EM | Admit: 2016-01-09 | Discharge: 2016-01-09 | Disposition: A | Discharge status: Home / Self Care | Payer: Medicaid Other | Attending: Emergency Medicine | Admitting: Emergency Medicine

## 2016-01-09 ENCOUNTER — Encounter (HOSPITAL_BASED_OUTPATIENT_CLINIC_OR_DEPARTMENT_OTHER): Payer: Self-pay

## 2016-01-09 DIAGNOSIS — K219 Gastro-esophageal reflux disease without esophagitis: Secondary | ICD-10-CM | POA: Insufficient documentation

## 2016-01-09 DIAGNOSIS — Z87448 Personal history of other diseases of urinary system: Secondary | ICD-10-CM | POA: Insufficient documentation

## 2016-01-09 DIAGNOSIS — Z8739 Personal history of other diseases of the musculoskeletal system and connective tissue: Secondary | ICD-10-CM | POA: Insufficient documentation

## 2016-01-09 DIAGNOSIS — Z8742 Personal history of other diseases of the female genital tract: Secondary | ICD-10-CM | POA: Insufficient documentation

## 2016-01-09 DIAGNOSIS — L0291 Cutaneous abscess, unspecified: Secondary | ICD-10-CM

## 2016-01-09 DIAGNOSIS — Z79899 Other long term (current) drug therapy: Secondary | ICD-10-CM | POA: Insufficient documentation

## 2016-01-09 DIAGNOSIS — Z8679 Personal history of other diseases of the circulatory system: Secondary | ICD-10-CM | POA: Insufficient documentation

## 2016-01-09 DIAGNOSIS — L02411 Cutaneous abscess of right axilla: Secondary | ICD-10-CM | POA: Insufficient documentation

## 2016-01-09 MED ORDER — SULFAMETHOXAZOLE-TRIMETHOPRIM 800-160 MG PO TABS: 1.0000 | ORAL_TABLET | Freq: Two times a day (BID) | ORAL | Status: AC

## 2016-01-09 MED ORDER — CEPHALEXIN 500 MG PO CAPS: 500.0000 mg | ORAL_CAPSULE | Freq: Four times a day (QID) | ORAL | Status: DC

## 2016-01-09 NOTE — ED Provider Notes (Signed)
CSN: [id number]     Arrival date & time 01/09/16  1348 History   First MD Initiated Contact with Patient 01/09/16 1405     Chief Complaint  Patient presents with  . Abscess     (Consider location/radiation/quality/duration/timing/severity/associated sxs/prior Treatment) HPI   Karen Meyer is a 30 y.o. female, patient with no pertinent past medical history, presenting to the ED with a painful abscess in her right eye exam left the last 2-1/2 weeks. Patient states that she had it drained on February 5, but it is still hurting. Patient was not given antibiotics at that time. Patient has not tried anything for the pain. Patient denies fever/chills, nausea/vomiting, neuro deficits, drainage from the area, or any other complaints.    Past Medical History  Diagnosis Date  . Mild acid reflux   . TMJ syndrome LEFT SIDE  . PONV (postoperative nausea and vomiting)   . Frequency of urination   . Urgency of urination   . Nocturia   . Interstitial cystitis   . Fibromyalgia   . Chronic pruritus   . Migraine headache   . GERD (gastroesophageal reflux disease)   . Infertility, female    Past Surgical History  Procedure Laterality Date  . Cysto/ hod/ instillation clorpactin  03-10-2011;  MAR 2011;  2009    FOR I.C.  . Cesarean section  2007  . Cysto with hydrodistension  08/16/2012    Procedure: CYSTOSCOPY/HYDRODISTENSION;  Surgeon: Valetta Fuller, MD;  Location: [facility];  Service: Urology;  Laterality: N/A;   Family History  Problem Relation Age of Onset  . Diabetes Mother   . Cancer Mother 87    2003; BREAST  . Diverticulosis Mother   . Hyperlipidemia Father    Social History  Substance Use Topics  . Smoking status: Never Smoker   . Smokeless tobacco: Never Used  . Alcohol Use: No     Comment: RARE   OB History    Gravida Para Term Preterm AB TAB SAB Ectopic Multiple Living   0 1 0 1 0 0 1     Review of Systems  Constitutional: Negative for  fever and chills.  Skin: Negative for color change and rash.       Right axilla "abscess."  Neurological: Negative for numbness.      Allergies  Cymbalta and Darvocet  Home Medications   Prior to Admission medications   Medication Sig Start Date End Date Taking? Authorizing Provider  cephALEXin (KEFLEX) 500 MG capsule Take 1 capsule (500 mg total) by mouth 4 (four) times daily. 01/09/16   Destiny Hagin C Deshara Rossi, PA-C  cetirizine (ZYRTEC) 10 MG tablet Take 30 mg by mouth at bedtime.     Historical Provider, MD  diphenhydrAMINE (BENADRYL) 25 MG tablet Take 12.5 mg by mouth every 6 (six) hours as needed for itching (hives).    Historical Provider, MD  omeprazole (PRILOSEC) 40 MG capsule Take 40 mg by mouth daily.    Historical Provider, MD  ondansetron (ZOFRAN) 4 MG tablet Take 1 tablet (4 mg total) by mouth every 8 (eight) hours as needed for nausea or vomiting. 12/16/15   Marily Memos, MD  ranitidine (ZANTAC) 75 MG tablet Take 75 mg by mouth daily as needed for heartburn.     Historical Provider, MD  sulfamethoxazole-trimethoprim (BACTRIM DS,SEPTRA DS) 800-160 MG tablet Take 1 tablet by mouth 2 (two) times daily. 01/09/16 01/16/16  Ziza Hastings C Camy Leder, PA-C   BP 120/68 mmHg  Pulse 66  Temp(Src) 98.9 F (37.2 C) (Oral)  Resp 15  Ht  (1.626 m)  Wt 73.936 kg  BMI 27.97 kg/m2  SpO2 98%  LMP 11/03/2015  Breastfeeding? No Physical Exam  Constitutional: She appears well-developed and well-nourished. No distress.  HENT:  Head: Normocephalic and atraumatic.  Eyes: Conjunctivae are normal.  Cardiovascular: Normal rate and regular rhythm.   Pulmonary/Chest: Effort normal.  Neurological: She is alert.  Skin: Skin is warm and dry. She is not diaphoretic.  Small mass with tenderness in the right axilla. No fluctuance or evidence of fluid collection. No indicators of cellulitis. No lymphadenopathy.  Nursing note and vitals reviewed.   ED Course  Procedures (including critical care time)  EMERGENCY  DEPARTMENT US SOFT TISSUE INTERPRETATION "Study: Limited Ultrasound of the noted body part in comments below"  INDICATIONS: Pain Multiple views of the body part are obtained with a multi-frequency linear probe  PERFORMED BY:  Myself  IMAGES ARCHIVED?: Yes  SIDE:Right   BODY PART:Axilla  FINDINGS: Abcess present and Cellulitis absent  LIMITATIONS: None  INTERPRETATION:  Abcess present and No cellulitis noted  COMMENT:  Although evidence of an abscess was noted, there is no central area of fluid collection that would benefit from I&D.   Imaging Review No results found. I have personally reviewed and evaluated these images as part of my medical decision-making.   EKG Interpretation None      MDM   Final diagnoses:  Abscess    Karen Meyer presents with an abscess in the right axilla.  No area of fluid collection that would benefit from I&D. No evidence of lymphadenopathy. No signs of systemic illness. Bactrim and Keflex prescriptions. Patient advised to follow-up with PCP for reevaluation and chronic management.  Filed Vitals:   01/09/16 1353  BP: 120/68  Pulse: 66  Temp: 98.9 F (37.2 C)  TempSrc: Oral  Resp: 15  Height:  (1.626 m)  Weight: 73.936 kg  SpO2: 98%     Anselm Pancoast, PA-C 01/09/16 1701  Azalia Bilis, MD 01/10/16 [phone]

## 2016-01-09 NOTE — ED Notes (Signed)
Pt reports right axillary cyst drained on 2/5 here and states she is having worsening pain.

## 2016-01-09 NOTE — ED Notes (Signed)
DC instructions reviewed with pt, also discussed the two (2) Rx as written by EDP. Stressed the importance of taking all of abx as presribed, opportunity for questions provided

## 2016-01-09 NOTE — ED Notes (Signed)
Pt returns with c/o tenderness and slight swelling of rt axilla, states she had an I&D here at this facility for abscess, approx 2 weeks ago.

## 2016-01-09 NOTE — Discharge Instructions (Signed)
You have been seen today for an abscess. The ultrasound reveals no area that would benefit from incision and drainage. Please take all of your antibiotics until finished!   You may develop abdominal discomfort or diarrhea from the antibiotic.  You may help offset this with probiotics which you can buy or get in yogurt. Do not eat or take the probiotics until 2 hours after your antibiotic. Follow up with PCP as as soon as possible for reevaluation and chronic management. Return to ED should symptoms worsen.   Emergency Department Resource Guide 1) Find a Doctor and Pay Out of Pocket Although you won't have to find out who is covered by your insurance plan, it is a good idea to ask around and get recommendations. You will then need to call the office and see if the doctor you have chosen will accept you as a new patient and what types of options they offer for patients who are self-pay. Some doctors offer discounts or will set up payment plans for their patients who do not have insurance, but you will need to ask so you aren't surprised when you get to your appointment.  2) Contact Your Local Health Department Not all health departments have doctors that can see patients for sick visits, but many do, so it is worth a call to see if yours does. If you don't know where your local health department is, you can check in your phone book. The CDC also has a tool to help you locate your state's health department, and many state websites also have listings of all of their local health departments.  3) Find a Walk-in Clinic If your illness is not likely to be very severe or complicated, you may want to try a walk in clinic. These are popping up all over the country in pharmacies, drugstores, and shopping centers. They're usually staffed by nurse practitioners or physician assistants that have been trained to treat common illnesses and complaints. They're usually fairly quick and inexpensive. However, if you have  serious medical issues or chronic medical problems, these are probably not your best option.  No Primary Care Doctor: - Call Health Connect at  [phone] - they can help you locate a primary care doctor that  accepts your insurance, provides certain services, etc. - Physician Referral Service- [phone]  Chronic Pain Problems: Organization         Address  Phone   Notes  [facility]  [phone] Patients need to be referred by their primary care doctor.   Medication Assistance: Organization         Address  Phone   Notes  [facility] Medication [facility] [address] [place], [place] [address] [phone] --Must be a resident of [facility] -- Must have NO insurance coverage whatsoever (no Medicaid/ Medicare, etc.) -- The pt. MUST have a primary care doctor that directs their care regularly and follows them in the community   [facility]  [phone]   [employer]  [phone]    Agencies that provide inexpensive medical care: Organization         Address  Phone   Notes  [facility]  [phone]   [facility]    [phone]   [facility] [address] [place], [place] [address] [phone]   [facility] [address] [place], [place] [phone]   [facility]    (  [phone]   [facility]    [phone]   [facility]  [address], [place] Phone:  [phone], Fax:  [phone] Hours of Operation:  9 am - 6 pm, M-F.  Also accepts Medicaid/Medicare and self-pay.  [facility]  [address], [place] Phone: [phone], Fax: [phone]. Hours of Operation:  8:30 am - 5:30 pm, M-F.  Also accepts Medicaid and self-pay.  [facility] [place] [address] [place] Phone: [phone]   [facility] [address] Natasha Bence [place], [place] [phone], Ext. [phone] Mondays & Thursdays: 7-9 AM.  First 15 patients are seen on a first come, first serve basis.    Medicaid-accepting [facility] Providers:  Organization         Address  Phone   Notes  [facility] [address], [place] [phone] Also accepts self-pay patients.  [facility] [address] Laurell Josephs [place], [place]  [phone]   [facility] [address], [place] [phone]   [facility] [address], [place] [phone]   Renaye Rakers [address], [place]   [phone] Only accepts [place] patients after they have their name applied to their card.   Self-Pay (no insurance) in [facility]:  Organization         Address  Phone   Notes  Sickle Cell Patients, [facility] [address] [place], [place] [phone]   [facility] [address] [place], [place] [phone]   [facility] [place]  [address], [place] [phone]   [facility]/Dr. Osei-Bonsu  [address] [place] or [address], [place] [phone] Phone number for both [place] and [place] locations is the same.  [facility] [address], [place] [phone]   [facility] [address] [place] or [address] [phone]   [facility] [address], [place] [phone], phone; [phone], fax Sees patients 1st and 3rd Saturday of every month.  Must not qualify for public or private insurance (i.e. Medicaid, Medicare, [place] [facility], Veterans' Benefits)  Household income should be no more than 200% of the poverty level The clinic cannot treat you if you are pregnant or think you are pregnant   Sexually transmitted diseases are not treated at the clinic.

## 2016-01-15 ENCOUNTER — Emergency Department (HOSPITAL_BASED_OUTPATIENT_CLINIC_OR_DEPARTMENT_OTHER)
Admission: EM | Admit: 2016-01-15 | Discharge: 2016-01-16 | Disposition: A | Discharge status: Home / Self Care | Payer: Medicaid Other | Attending: Emergency Medicine | Admitting: Emergency Medicine

## 2016-01-15 ENCOUNTER — Encounter (HOSPITAL_BASED_OUTPATIENT_CLINIC_OR_DEPARTMENT_OTHER): Payer: Self-pay | Admitting: Emergency Medicine

## 2016-01-15 DIAGNOSIS — Z79899 Other long term (current) drug therapy: Secondary | ICD-10-CM | POA: Insufficient documentation

## 2016-01-15 DIAGNOSIS — R05 Cough: Secondary | ICD-10-CM | POA: Diagnosis present

## 2016-01-15 DIAGNOSIS — Z8679 Personal history of other diseases of the circulatory system: Secondary | ICD-10-CM | POA: Insufficient documentation

## 2016-01-15 DIAGNOSIS — Z87448 Personal history of other diseases of urinary system: Secondary | ICD-10-CM | POA: Diagnosis not present

## 2016-01-15 DIAGNOSIS — K219 Gastro-esophageal reflux disease without esophagitis: Secondary | ICD-10-CM | POA: Insufficient documentation

## 2016-01-15 DIAGNOSIS — Z8742 Personal history of other diseases of the female genital tract: Secondary | ICD-10-CM | POA: Diagnosis not present

## 2016-01-15 DIAGNOSIS — Z792 Long term (current) use of antibiotics: Secondary | ICD-10-CM | POA: Insufficient documentation

## 2016-01-15 DIAGNOSIS — Z872 Personal history of diseases of the skin and subcutaneous tissue: Secondary | ICD-10-CM | POA: Insufficient documentation

## 2016-01-15 DIAGNOSIS — Z8739 Personal history of other diseases of the musculoskeletal system and connective tissue: Secondary | ICD-10-CM | POA: Insufficient documentation

## 2016-01-15 DIAGNOSIS — B349 Viral infection, unspecified: Secondary | ICD-10-CM

## 2016-01-15 MED ORDER — LORATADINE 10 MG PO TABS
10.0000 mg | ORAL_TABLET | Freq: Once | ORAL | Status: AC
  Administered 2016-01-15: 10 mg via ORAL
  Filled 2016-01-15: qty 1

## 2016-01-15 MED ORDER — ONDANSETRON 8 MG PO TBDP
8.0000 mg | ORAL_TABLET | Freq: Once | ORAL | Status: AC
  Administered 2016-01-15: 8 mg via ORAL
  Filled 2016-01-15: qty 1

## 2016-01-15 MED ORDER — IBUPROFEN 800 MG PO TABS
800.0000 mg | ORAL_TABLET | Freq: Once | ORAL | Status: AC
  Administered 2016-01-15: 800 mg via ORAL
  Filled 2016-01-15: qty 1

## 2016-01-15 NOTE — ED Notes (Signed)
Pt c/o cough, congestion, nausea, chills and body aches, sore throat x 2 days.

## 2016-01-15 NOTE — ED Notes (Signed)
MD at bedside. 

## 2016-01-15 NOTE — ED Notes (Signed)
30 yo with generalized body aches and chills with sore throat/cough. Temp 100.2 at triage.

## 2016-01-15 NOTE — ED Provider Notes (Signed)
CSN: [id number]     Arrival date & time 01/15/16  2223 History  By signing my name below, I, Freida BusmanDiana Omoyeni, attest that this documentation has been prepared under the direction and in the presence of Tamarius Rosenfield, MD . Electronically Signed: Freida Busmaniana Omoyeni, Scribe. 01/15/2016. 11:42 PM.    Chief Complaint  Patient presents with  . URI    Patient is a 30 y.o. female presenting with URI. The history is provided by the patient. No language interpreter was used.  URI Presenting symptoms: congestion, cough, fever and sore throat   Severity:  Moderate Onset quality:  Gradual Timing:  Constant Progression:  Unchanged Chronicity:  New Relieved by:  Nothing Worsened by:  Nothing tried Ineffective treatments:  None tried Associated symptoms: myalgias   Associated symptoms: no arthralgias, no neck pain, no sinus pain, no swollen glands and no wheezing   Risk factors: sick contacts   Risk factors: not elderly, no chronic cardiac disease, no chronic kidney disease, no chronic respiratory disease, no diabetes mellitus and no immunosuppression     HPI Comments:  Estill BattenMargiana F Lerebours is a 30 y.o. female who presents to the Emergency Department complaining of dry cough since yesterday. She reports associated sore throat, congestion, chills, and generalized body aches. She reports sick contacts at work with similar symptoms. No alleviating factors noted; no treatments tried.   Past Medical History  Diagnosis Date  . Mild acid reflux   . TMJ syndrome LEFT SIDE  . PONV (postoperative nausea and vomiting)   . Frequency of urination   . Urgency of urination   . Nocturia   . Interstitial cystitis   . Fibromyalgia   . Chronic pruritus   . Migraine headache   . GERD (gastroesophageal reflux disease)   . Infertility, female    Past Surgical History  Procedure Laterality Date  . Cysto/ hod/ instillation clorpactin  03-10-2011;  MAR 2011;  2009    FOR I.C.  . Cesarean section  2007  . Cysto with  hydrodistension  08/16/2012    Procedure: CYSTOSCOPY/HYDRODISTENSION;  Surgeon: Valetta Fulleravid S Grapey, MD;  [facility] ;  Service: Urology;  Laterality: N/A;   Family History  Problem Relation Age of Onset  . Diabetes Mother   . Cancer Mother 8944    2003; BREAST  . Diverticulosis Mother   . Hyperlipidemia Father    Social History  Substance Use Topics  . Smoking status: Never Smoker   . Smokeless tobacco: Never Used  . Alcohol Use: No     Comment: RARE   OB History    Gravida Para Term Preterm AB TAB SAB Ectopic Multiple Living   3 1 1  0 1 0 1 0 0 1     Review of Systems  Constitutional: Positive for fever.  HENT: Positive for congestion and sore throat.   Eyes: Negative for photophobia.  Respiratory: Positive for cough. Negative for shortness of breath and wheezing.   Musculoskeletal: Positive for myalgias. Negative for arthralgias, neck pain and neck stiffness.  All other systems reviewed and are negative.  Allergies  Cymbalta and Darvocet  Home Medications   Prior to Admission medications   Medication Sig Start Date End Date Taking? Authorizing Provider  cephALEXin (KEFLEX) 500 MG capsule Take 1 capsule (500 mg total) by mouth 4 (four) times daily. 01/09/16   Shawn C Joy, PA-C  cetirizine (ZYRTEC) 10 MG tablet Take 30 mg by mouth at bedtime.     Historical Provider, MD  diphenhydrAMINE (BENADRYL) 25 MG tablet Take 12.5 mg by mouth every 6 (six) hours as needed for itching (hives).    Historical Provider, MD  omeprazole (PRILOSEC) 40 MG capsule Take 40 mg by mouth daily.    Historical Provider, MD  ondansetron (ZOFRAN) 4 MG tablet Take 1 tablet (4 mg total) by mouth every 8 (eight) hours as needed for nausea or vomiting. 12/16/15   Marily Memos, MD  ranitidine (ZANTAC) 75 MG tablet Take 75 mg by mouth daily as needed for heartburn.     Historical Provider, MD  sulfamethoxazole-trimethoprim (BACTRIM DS,SEPTRA DS) 800-160 MG tablet Take 1 tablet by mouth 2  (two) times daily. 01/09/16 01/16/16  Shawn C Joy, PA-C   BP 122/78 mmHg  Pulse 100  Temp(Src) 100.2 F (37.9 C) (Oral)  Resp 18  Ht  (1.626 m)  Wt 150 lb (68.04 kg)  BMI 25.73 kg/m2  SpO2 100%  LMP 01/13/2016 Physical Exam  Constitutional: She is oriented to person, place, and time. She appears well-developed and well-nourished. No distress.  HENT:  Head: Normocephalic and atraumatic.  Mouth/Throat: Oropharynx is clear and moist. No oropharyngeal exudate.  Moist mucous membranes   Eyes: Conjunctivae and EOM are normal. Pupils are equal, round, and reactive to light.  Neck: Normal range of motion. Neck supple. No JVD present. No tracheal deviation present.  Trachea midline  Cardiovascular: Normal rate, regular rhythm, normal heart sounds and intact distal pulses.   Pulmonary/Chest: Effort normal and breath sounds normal. No respiratory distress. She has no wheezes. She has no rales. She exhibits no tenderness.  Abdominal: Soft. Bowel sounds are normal. She exhibits no distension. There is no tenderness. There is no rebound and no guarding.  Musculoskeletal: Normal range of motion. She exhibits no tenderness.  Lymphadenopathy:    She has no cervical adenopathy.  Neurological: She is alert and oriented to person, place, and time. She has normal reflexes.  Skin: Skin is warm and dry.  Psychiatric: She has a normal mood and affect. Her behavior is normal.  Nursing note and vitals reviewed.   ED Course  Procedures   DIAGNOSTIC STUDIES:  Oxygen Saturation is 100% on RA, normal by my interpretation.    COORDINATION OF CARE:  11:37 PM Discussed treatment plan with pt at bedside and pt agreed to plan.  Labs Review Labs Reviewed - No data to display  Imaging Review No results found. I have personally reviewed and evaluated these images and lab results as part of my medical decision-making.   EKG Interpretation None      MDM   Final diagnoses:  None    Viral  syndrome.  Well appearing will treat symptomatically.  Strict return precautions given.    I personally performed the services described in this documentation, which was scribed in my presence. The recorded information has been reviewed and is accurate.      Cy Blamer, MD 01/16/16 7829

## 2016-01-16 MED ORDER — NAPROXEN 500 MG PO TABS: 500.0000 mg | ORAL_TABLET | Freq: Two times a day (BID) | ORAL | Status: DC

## 2016-01-16 MED ORDER — FLUTICASONE PROPIONATE 50 MCG/ACT NA SUSP: 2.0000 | Freq: Every day | NASAL | Status: DC

## 2016-01-16 MED ORDER — LORATADINE 10 MG PO TABS
10.0000 mg | ORAL_TABLET | Freq: Every day | ORAL | Status: DC
Start: 2016-01-16 — End: 2016-05-07

## 2016-01-16 NOTE — Discharge Instructions (Signed)
Viral Infections °A viral infection can be caused by different types of viruses. Most viral infections are not serious and resolve on their own. However, some infections may cause severe symptoms and may lead to further complications. °SYMPTOMS °Viruses can frequently cause: °· Minor sore throat. °· Aches and pains. °· Headaches. °· Runny nose. °· Different types of rashes. °· Watery eyes. °· Tiredness. °· Cough. °· Loss of appetite. °· Gastrointestinal infections, resulting in nausea, vomiting, and diarrhea. °These symptoms do not respond to antibiotics because the infection is not caused by bacteria. However, you might catch a bacterial infection following the viral infection. This is sometimes called a "superinfection." Symptoms of such a bacterial infection may include: °· Worsening sore throat with pus and difficulty swallowing. °· Swollen neck glands. °· Chills and a high or persistent fever. °· Severe headache. °· Tenderness over the sinuses. °· Persistent overall ill feeling (malaise), muscle aches, and tiredness (fatigue). °· Persistent cough. °· Yellow, green, or brown mucus production with coughing. °HOME CARE INSTRUCTIONS  °· Only take over-the-counter or prescription medicines for pain, discomfort, diarrhea, or fever as directed by your caregiver. °· Drink enough water and fluids to keep your urine clear or pale yellow. Sports drinks can provide valuable electrolytes, sugars, and hydration. °· Get plenty of rest and maintain proper nutrition. Soups and broths with crackers or rice are fine. °SEEK IMMEDIATE MEDICAL CARE IF:  °· You have severe headaches, shortness of breath, chest pain, neck pain, or an unusual rash. °· You have uncontrolled vomiting, diarrhea, or you are unable to keep down fluids. °· You or your child has an oral temperature above 102° F (38.9° C), not controlled by medicine. °· Your baby is older than 3 months with a rectal temperature of 102° F (38.9° C) or higher. °· Your baby is 3  months old or younger with a rectal temperature of 100.4° F (38° C) or higher. °MAKE SURE YOU:  °· Understand these instructions. °· Will watch your condition. °· Will get help right away if you are not doing well or get worse. °  °This information is not intended to replace advice given to you by your health care provider. Make sure you discuss any questions you have with your health care provider. °  °Document Released: 08/06/2005 Document Revised: 01/19/2012 Document Reviewed: 04/04/2015 °Elsevier Interactive Patient Education ©2016 [employer] Inc[employer] ° °

## 2016-01-16 NOTE — ED Notes (Signed)
Pt given d/c instructions as per chart. Verbalizes understanding. No questions. Rx x 3 

## 2016-05-07 ENCOUNTER — Inpatient Hospital Stay (HOSPITAL_COMMUNITY)
Admission: AD | Admit: 2016-05-07 | Discharge: 2016-05-07 | Disposition: A | Discharge status: Home / Self Care | Payer: PRIVATE HEALTH INSURANCE | Source: Ambulatory Visit | Attending: Obstetrics and Gynecology | Admitting: Obstetrics and Gynecology

## 2016-05-07 ENCOUNTER — Encounter (HOSPITAL_COMMUNITY): Payer: Self-pay

## 2016-05-07 DIAGNOSIS — R110 Nausea: Secondary | ICD-10-CM | POA: Diagnosis not present

## 2016-05-07 DIAGNOSIS — M797 Fibromyalgia: Secondary | ICD-10-CM | POA: Insufficient documentation

## 2016-05-07 DIAGNOSIS — K219 Gastro-esophageal reflux disease without esophagitis: Secondary | ICD-10-CM | POA: Insufficient documentation

## 2016-05-07 LAB — URINALYSIS, ROUTINE W REFLEX MICROSCOPIC
BILIRUBIN URINE: NEGATIVE
Glucose, UA: NEGATIVE mg/dL
HGB URINE DIPSTICK: NEGATIVE
Ketones, ur: NEGATIVE mg/dL
Leukocytes, UA: NEGATIVE
Nitrite: NEGATIVE
PROTEIN: NEGATIVE mg/dL
Specific Gravity, Urine: <1.005 — ABNORMAL LOW (ref 1.005–1.030)
pH: 6 (ref 5.0–8.0)

## 2016-05-07 LAB — POCT PREGNANCY, URINE: PREG TEST UR: NEGATIVE

## 2016-05-07 MED ORDER — ONDANSETRON 8 MG PO TBDP: 8.0000 mg | ORAL_TABLET | Freq: Three times a day (TID) | ORAL | Status: DC | PRN

## 2016-05-07 NOTE — Discharge Instructions (Signed)
Nausea, Adult °Nausea is the feeling that you have an upset stomach or have to vomit. Nausea by itself is not likely a serious concern, but it may be an early sign of more serious medical problems. As nausea gets worse, it can lead to vomiting. If vomiting develops, there is the risk of dehydration.  °CAUSES  °· Viral infections. °· Food poisoning. °· Medicines. °· Pregnancy. °· Motion sickness. °· Migraine headaches. °· Emotional distress. °· Severe pain from any source. °· Alcohol intoxication. °HOME CARE INSTRUCTIONS °· Get plenty of rest. °· Ask your caregiver about specific rehydration instructions. °· Eat small amounts of food and sip liquids more often. °· Take all medicines as told by your caregiver. °SEEK MEDICAL CARE IF: °· You have not improved after 2 days, or you get worse. °· You have a headache. °SEEK IMMEDIATE MEDICAL CARE IF:  °· You have a fever. °· You faint. °· You keep vomiting or have blood in your vomit. °· You are extremely weak or dehydrated. °· You have dark or bloody stools. °· You have severe chest or abdominal pain. °MAKE SURE YOU: °· Understand these instructions. °· Will watch your condition. °· Will get help right away if you are not doing well or get worse. °  °This information is not intended to replace advice given to you by your health care provider. Make sure you discuss any questions you have with your health care provider. °  °Document Released: 12/04/2004 Document Revised: 11/17/2014 Document Reviewed: 07/09/2011 °Elsevier Interactive Patient Education ©2016 [employer] Inc. ° °

## 2016-05-07 NOTE — MAU Provider Note (Signed)
History     CSN: [id number]  Arrival date and time: 05/07/16 2042   First Provider Initiated Contact with Patient 05/07/16 2225      Chief Complaint  Patient presents with  . Nausea   HPI Comments: Karen Meyer is a 30 y.o. G3P1011 who presents today with nausea. She states that she has been nauseous for about one month. She vomited once about one week ago, but has not had any other emesis. She states that the only medication is on is zyrtec. She denies any changes to her diet. She denies any sick contacts.  She denies any abdominal pain. She has not tried any medications at home. She states that nothing makes the nausea worse and nothing seems to make it any better. She reports that it is constant.     Past Medical History  Diagnosis Date  . Mild acid reflux   . TMJ syndrome LEFT SIDE  . PONV (postoperative nausea and vomiting)   . Frequency of urination   . Urgency of urination   . Nocturia   . Interstitial cystitis   . Fibromyalgia   . Chronic pruritus   . Migraine headache   . GERD (gastroesophageal reflux disease)   . Infertility, female     Past Surgical History  Procedure Laterality Date  . Cysto/ hod/ instillation clorpactin  03-10-2011;  MAR 2011;  2009    FOR [facility].  . Cesarean section  2007  . Cysto with hydrodistension  08/16/2012    Procedure: CYSTOSCOPY/HYDRODISTENSION;  Surgeon: Valetta Fulleravid S Grapey, MD;  [facility] [place];  Service: Urology;  Laterality: N/A;    Family History  Problem Relation Age of Onset  . Diabetes Mother   . Cancer Mother [id number]    2003; BREAST  . Diverticulosis Mother   . Hyperlipidemia Father     Social History  Substance Use Topics  . Smoking status: Never Smoker   . Smokeless tobacco: Never Used  . Alcohol Use: No     Comment: RARE    Allergies:  Allergies  Allergen Reactions  . Cymbalta [Duloxetine Hcl] Shortness Of Breath    Difficulty Breathing   . Propoxyphene Hives  . Darvocet [Propoxyphene  N-Acetaminophen] Hives    Prescriptions prior to admission  Medication Sig Dispense Refill Last Dose  . cetirizine (ZYRTEC) 10 MG tablet Take 30 mg by mouth at bedtime.    05/07/2016 at Unknown time  . cephALEXin (KEFLEX) 500 MG capsule Take 1 capsule (500 mg total) by mouth 4 (four) times daily. (Patient not taking: Reported on 05/07/2016) 20 capsule 0   . fluticasone (FLONASE) 50 MCG/ACT nasal spray Place 2 sprays into both nostrils daily. (Patient not taking: Reported on 05/07/2016) 16 g 0   . loratadine (CLARITIN) 10 MG tablet Take 1 tablet (10 mg total) by mouth daily. (Patient not taking: Reported on 05/07/2016) 5 tablet 0   . naproxen (NAPROSYN) 500 MG tablet Take 1 tablet (500 mg total) by mouth 2 (two) times daily. (Patient not taking: Reported on 05/07/2016) 30 tablet 0   . ondansetron (ZOFRAN) 4 MG tablet Take 1 tablet (4 mg total) by mouth every 8 (eight) hours as needed for nausea or vomiting. (Patient not taking: Reported on 05/07/2016) 12 tablet 0     Review of Systems  Constitutional: Negative for fever and chills.  Gastrointestinal: Positive for nausea. Negative for vomiting, abdominal pain, diarrhea (more frequent bowel movements, but normal consitency. ) and constipation.  Genitourinary: Negative for dysuria,  urgency and frequency.   Physical Exam   Blood pressure 116/77, pulse 67, temperature 98.4 F (36.9 C), temperature source Oral, resp. rate 18, last menstrual period 10/25/2015.  Physical Exam  Nursing note and vitals reviewed. Constitutional: She is oriented to person, place, and time. She appears well-developed and well-nourished. No distress.  HENT:  Head: Normocephalic.  Cardiovascular: Normal rate.   Respiratory: Effort normal.  GI: Soft. There is no tenderness. There is no rebound.  Musculoskeletal: Normal range of motion.  Neurological: She is alert and oriented to person, place, and time.  Skin: Skin is warm and dry.  Psychiatric: She has a normal mood and  affect.   Results for orders placed or performed during the hospital encounter of 05/07/16 (from the past 24 hour(s))  Urinalysis, Routine w reflex microscopic (not [facility])     Status: Abnormal   Collection Time: 05/07/16  8:50 PM  Result Value Ref Range   Color, Urine YELLOW YELLOW   APPearance CLEAR CLEAR   Specific Gravity, Urine <1.005 (L) 1.005 - 1.030   pH 6.0 5.0 - 8.0   Glucose, UA NEGATIVE NEGATIVE mg/dL   Hgb urine dipstick NEGATIVE NEGATIVE   Bilirubin Urine NEGATIVE NEGATIVE   Ketones, ur NEGATIVE NEGATIVE mg/dL   Protein, ur NEGATIVE NEGATIVE mg/dL   Nitrite NEGATIVE NEGATIVE   Leukocytes, UA NEGATIVE NEGATIVE  Pregnancy, urine POC     Status: None   Collection Time: 05/07/16  9:12 PM  Result Value Ref Range   Preg Test, Ur NEGATIVE NEGATIVE     MAU Course  Procedures  MDM   Assessment and Plan   1. Nausea in adult    DC home Comfort measures reviewed  RX: zofran PRN #[id number]   Follow-up Information    [facility].   Specialty:  Urgent Care   Why:  If symptoms worsen   Contact information:   [address] [place] [id number] [phone]        Tawnya CrookHogan, Bella Brummet Donovan 05/07/2016, 10:28 PM

## 2016-05-07 NOTE — MAU Note (Signed)
Patient states that she is nausea and through up once a week ago. Patient states that her nausea started a month and went away for 3 days but came back.

## 2017-06-30 IMAGING — US US OB COMP LESS 14 WK
1 series · 15 of 28 positions shown · non-contrast
Comparison: Pelvic ultrasound 02/20/2015

CLINICAL DATA: Pelvic pain. Suprapubic cramping for 47 days.
Quantitative beta HCG is pending. LMP 10/25/2015. Gravida 3 para 1
SAB 1. Previous C-section and multiple bladder surgeries. By LMP
patient is 5 weeks 0 days. EDC by LMP is 07/31/2016.

EXAM:
OBSTETRIC <14 WK US AND TRANSVAGINAL OB US
TECHNIQUE: Both transabdominal and transvaginal ultrasound examinations were
performed for complete evaluation of the gestation as well as the
maternal uterus, adnexal regions, and pelvic cul-de-sac.
Transvaginal technique was performed to assess early pregnancy.

[Series 1: us ob comp less 14 wk · 81 acquisitions, 15 frames shown]
[im 1/81]
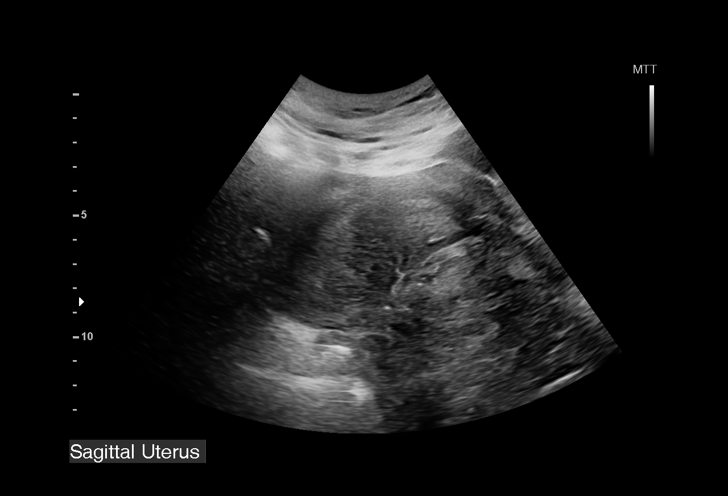
[im 6/81]
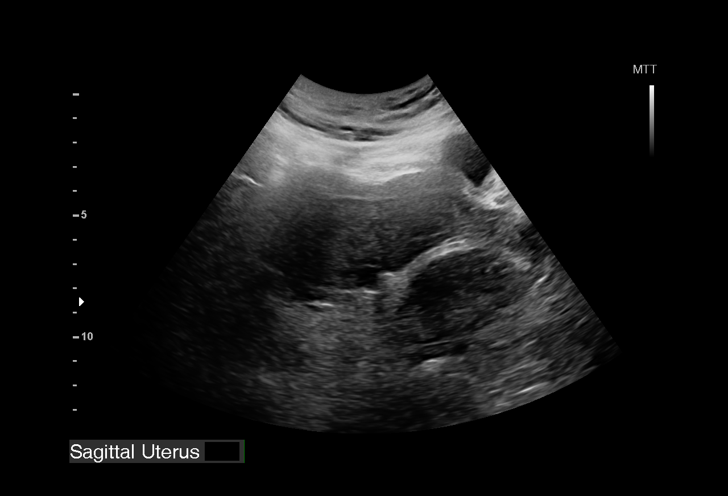
[im 12/81]
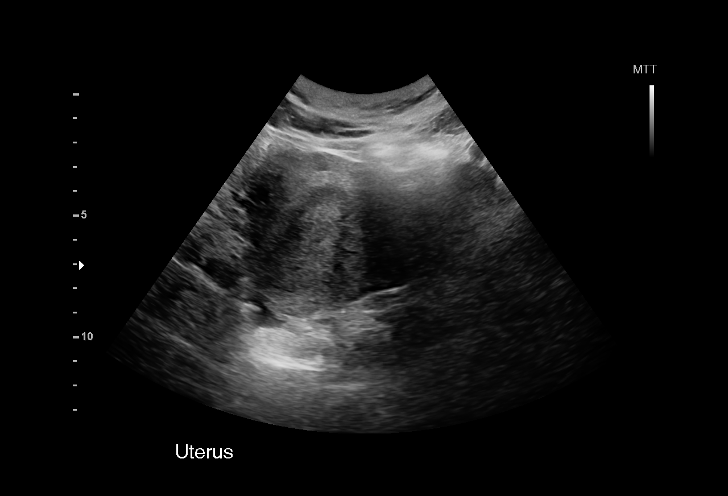
[im 18/81]
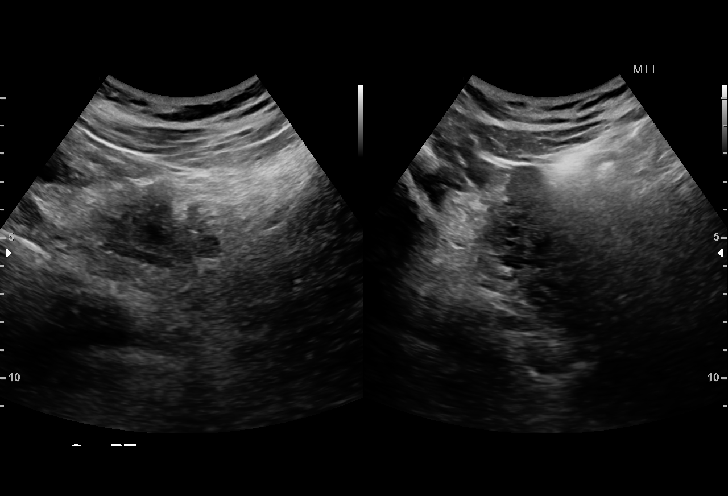
[im 24/81]
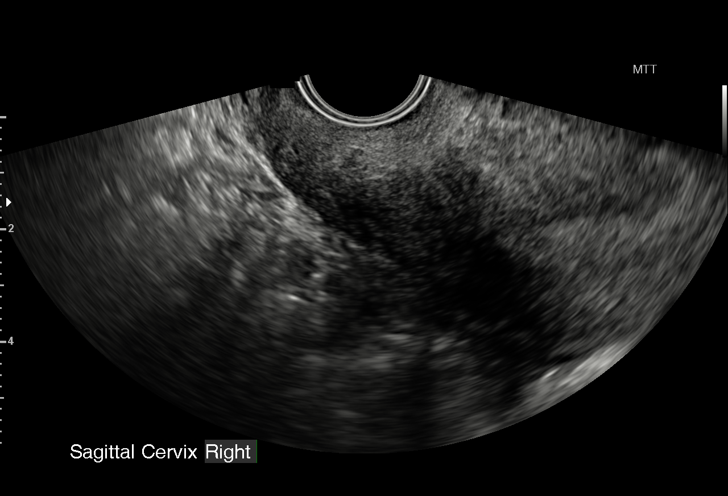
[im 30/81]
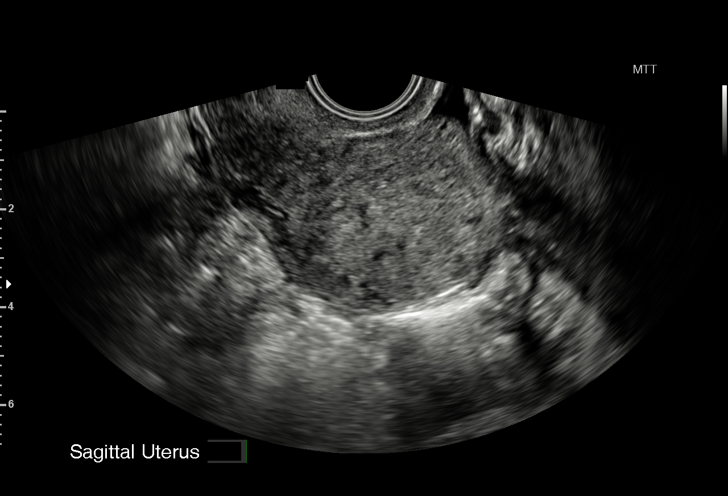
[im 36/81]
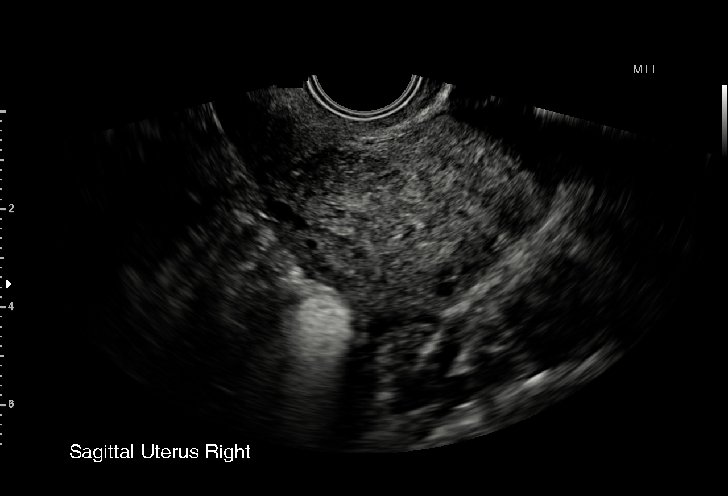
[im 42/81]
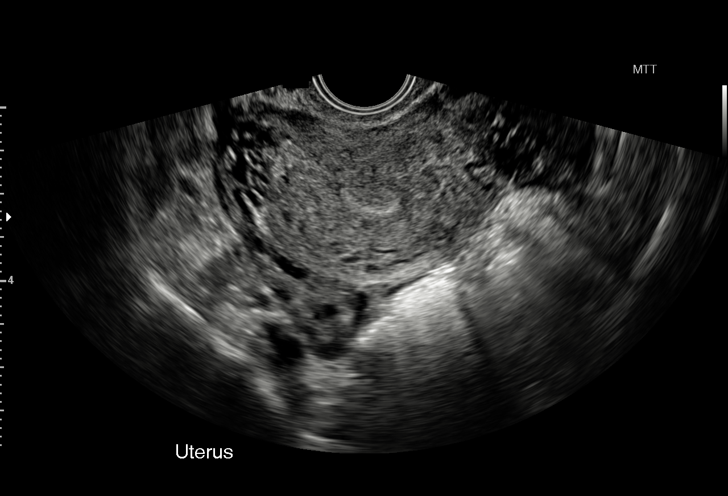
[im 45/81]
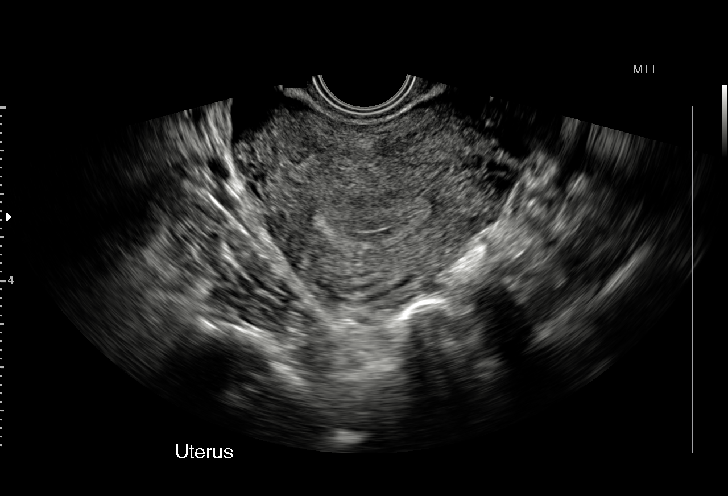
[im 51/81]
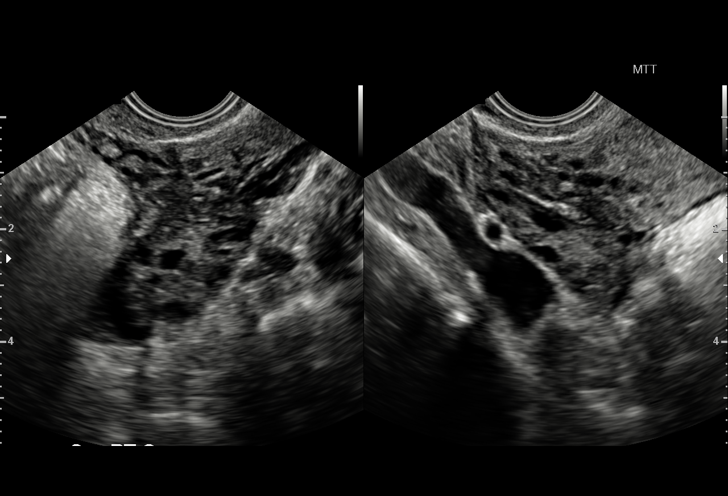
[im 57/81]
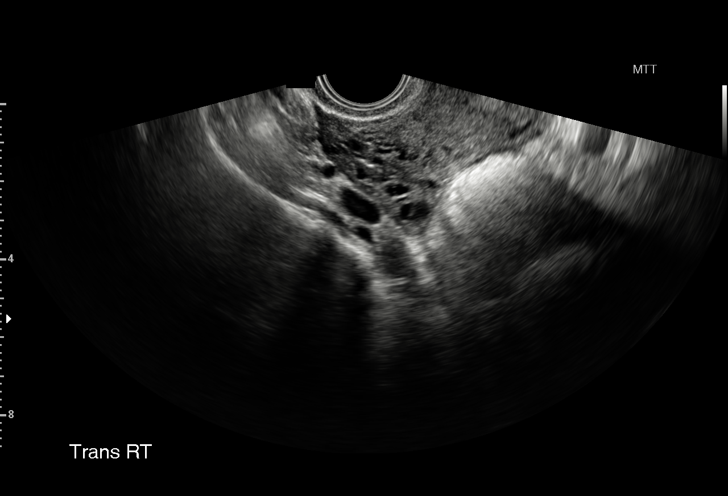
[im 63/81]
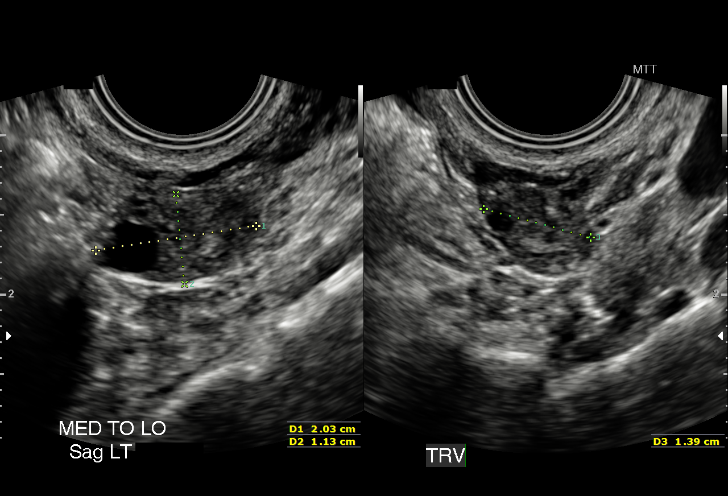
[im 69/81]
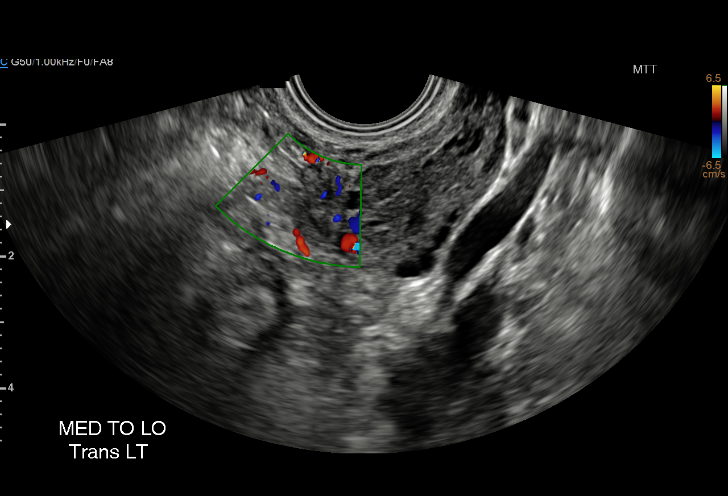
[im 75/81]
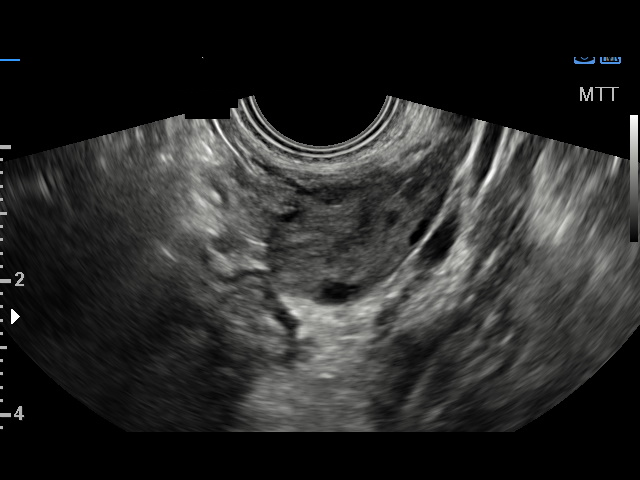
[im 81/81]
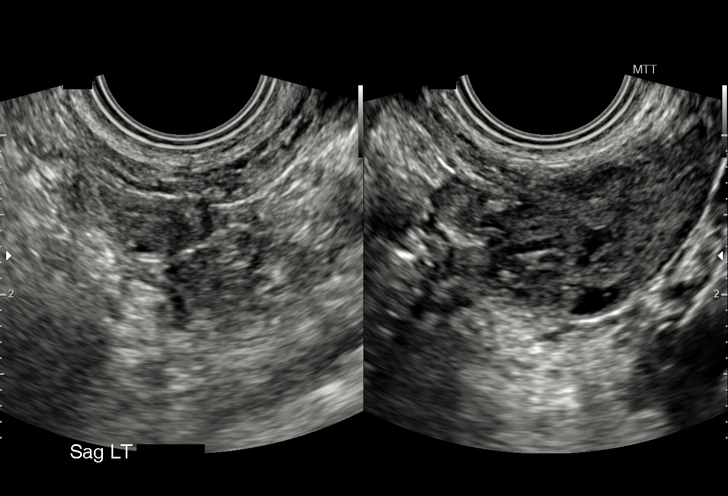

[15 of 28 positions shown; findings below may reference images not displayed]

FINDINGS: Intrauterine gestational sac: None seen

Yolk sac:  None seen

Embryo:  None seen

Cardiac Activity: None

Heart Rate: Not applicable  bpm

Subchorionic hemorrhage:  None visualized.

Maternal uterus/adnexae: The right ovary has a normal appearance.
Probable left corpus luteum cyst. A small solid mass is identified
adjacent to but separate from the left ovary measuring 1.0 x 0.7 x
0.7 cm. There is a trace free pelvic fluid.
IMPRESSION: 1. No evidence for intrauterine pregnancy.
2. Suspect left adnexal pregnancy, measuring 1.0 cm.
Critical Value/emergent results were called by telephone at the time
of interpretation on 11/29/2015 at [DATE] to Dr. Lorraine Jim, who
verbally acknowledged these results.

## 2017-07-15 ENCOUNTER — Encounter (HOSPITAL_COMMUNITY): Payer: Self-pay | Admitting: Emergency Medicine

## 2017-07-15 DIAGNOSIS — R42 Dizziness and giddiness: Secondary | ICD-10-CM | POA: Insufficient documentation

## 2017-07-15 DIAGNOSIS — Z79899 Other long term (current) drug therapy: Secondary | ICD-10-CM | POA: Diagnosis not present

## 2017-07-15 LAB — CBG MONITORING, ED
GLUCOSE-CAPILLARY: 82 mg/dL (ref 65–99)
Glucose-Capillary: 76 mg/dL (ref 65–99)

## 2017-07-15 LAB — BASIC METABOLIC PANEL
Anion gap: 11 (ref 5–15)
BUN: 13 mg/dL (ref 6–20)
CHLORIDE: 99 mmol/L — AB (ref 101–111)
CO2: 25 mmol/L (ref 22–32)
CREATININE: 0.81 mg/dL (ref 0.44–1.00)
Calcium: 9.3 mg/dL (ref 8.9–10.3)
GFR calc Af Amer: >60 mL/min (ref >60)
GFR calc non Af Amer: >60 mL/min (ref >60)
GLUCOSE: 80 mg/dL (ref 65–99)
Potassium: 4 mmol/L (ref 3.5–5.1)
Sodium: 135 mmol/L (ref 135–145)

## 2017-07-15 LAB — CBC WITH DIFFERENTIAL/PLATELET
Basophils Absolute: 0 10*3/uL (ref 0.0–0.1)
Basophils Relative: 1 %
EOS ABS: 0.1 10*3/uL (ref 0.0–0.7)
Eosinophils Relative: 2 %
HEMATOCRIT: 39 % (ref 36.0–46.0)
HEMOGLOBIN: 13.4 g/dL (ref 12.0–15.0)
LYMPHS ABS: 1.6 10*3/uL (ref 0.7–4.0)
LYMPHS PCT: 39 %
MCH: 31.5 pg (ref 26.0–34.0)
MCHC: 34.4 g/dL (ref 30.0–36.0)
MCV: 91.5 fL (ref 78.0–100.0)
MONOS PCT: 10 %
Monocytes Absolute: 0.4 10*3/uL (ref 0.1–1.0)
NEUTROS ABS: 2 10*3/uL (ref 1.7–7.7)
NEUTROS PCT: 48 %
Platelets: 304 10*3/uL (ref 150–400)
RBC: 4.26 MIL/uL (ref 3.87–5.11)
RDW: 12.7 % (ref 11.5–15.5)
WBC: 4.2 10*3/uL (ref 4.0–10.5)

## 2017-07-15 LAB — POC URINE PREG, ED: PREG TEST UR: NEGATIVE

## 2017-07-15 LAB — URINALYSIS, ROUTINE W REFLEX MICROSCOPIC
Bilirubin Urine: NEGATIVE
Glucose, UA: NEGATIVE mg/dL
Hgb urine dipstick: NEGATIVE
KETONES UR: NEGATIVE mg/dL
Leukocytes, UA: NEGATIVE
Nitrite: NEGATIVE
PROTEIN: NEGATIVE mg/dL
Specific Gravity, Urine: 1.005 (ref 1.005–1.030)
pH: 6 (ref 5.0–8.0)

## 2017-07-15 MED ORDER — GLUCOSE-VITAMIN C 4-6 GM-MG PO CHEW
4.0000 | CHEWABLE_TABLET | ORAL | Status: DC | PRN
  Administered 2017-07-15: 4 via ORAL
  Filled 2017-07-15: qty 4

## 2017-07-15 NOTE — ED Triage Notes (Signed)
Repeat CBG 82, pt still refusing drink. Offered crackers.

## 2017-07-15 NOTE — ED Triage Notes (Signed)
Pt states she is having some lightheaded and having low blood sugar at home, CBG on ED 77 pt refusing to drink any juice or soda on triage.

## 2017-07-16 ENCOUNTER — Emergency Department (HOSPITAL_COMMUNITY)
Admission: EM | Admit: 2017-07-16 | Discharge: 2017-07-16 | Disposition: A | Discharge status: Home / Self Care | Payer: Medicaid Other | Attending: Emergency Medicine | Admitting: Emergency Medicine

## 2017-07-16 DIAGNOSIS — R42 Dizziness and giddiness: Secondary | ICD-10-CM

## 2017-07-16 LAB — I-STAT BETA HCG BLOOD, ED (MC, WL, AP ONLY)

## 2017-07-16 LAB — CBG MONITORING, ED: Glucose-Capillary: 92 mg/dL (ref 65–99)

## 2017-07-16 NOTE — ED Provider Notes (Signed)
[facility]-EMERGENCY DEPT Provider Note   CSN: [id number] Arrival date & time: 07/15/17  2113     History   Chief Complaint Chief Complaint  Patient presents with  . Hypoglycemia    HPI Karen Meyer is a 31 y.o. female.  HPI   31yo female with history of fibromyalgia, interstitial cystitis, migraines, who presents with concern for fatigue, lightheadedness and nausea. Reports she woke up this morning and "just didn't feel right, I knew something was wrong."  Reports extreme fatigue.  Reports she is in tune to her body given her history of autoimmune disorders and feels when something isn't right.  She has been on a restricted diet given interstitial cystitis, and was going to add additional restrictions for antihistamine effects.  When she began to feel lightheaded she remembered she had a glucometer and found and checked glucose and found it was 50. She became nervous given family hx of DM.  She ate something and it went immediately up to 123 then fell again down to 70. Reports she went from sitting to standing and had increased lightheadedness. Had brief tingling to right arm that improved. No other numbness, weakness, facial droop, no double vision or visual field deficits, no difficulty walking. Does report sensation of blurred vision bilaterally feels she needs to blink more to see.  Has "thick glasses" but does not have them with her right now, reports at baseline she is only able to see top 2 lines on eye tests. No chest pain, no dyspnea, no black or bloody stools, no emesis or diarrhea.   Past Medical History:  Diagnosis Date  . Chronic pruritus   . Fibromyalgia   . Frequency of urination   . GERD (gastroesophageal reflux disease)   . Infertility, female   . Interstitial cystitis   . Migraine headache   . Mild acid reflux   . Nocturia   . PONV (postoperative nausea and vomiting)   . TMJ syndrome LEFT SIDE  . Urgency of urination     Patient Active Problem List   Diagnosis Date Noted  . Pregnancy with uncertain fetal viability 11/29/2015  . LEUKOPENIA, CHRONIC 08/08/2010  . APHTHOUS STOMATITIS 08/08/2010  . MYALGIA 08/08/2010  . CHANGE IN BOWELS 08/08/2010  . 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 TMJ SYNDROME 07/31/2010  . GERD 07/31/2010  . UTI'S, HX OF 07/31/2010  . CYSTITIS, HX OF 07/31/2010  . THRUSH 07/30/2010    Past Surgical History:  Procedure Laterality Date  . CESAREAN SECTION  2007  . CYSTO WITH HYDRODISTENSION  08/16/2012   Procedure: CYSTOSCOPY/HYDRODISTENSION;  Surgeon: Valetta Fulleravid S Grapey, MD;  [facility];  Service: Urology;  Laterality: N/A;  . CYSTO/ HOD/ INSTILLATION CLORPACTIN  03-10-2011;  MAR 2011;  2009   FOR [facility]    OB History    Gravida Para Term Preterm AB Living   3 1 1  0 1 1   SAB TAB Ectopic Multiple Live Births   1 0 0 0         Home Medications    Prior to Admission medications   Medication Sig Start Date End Date Taking? Authorizing Provider  cetirizine (ZYRTEC) 10 MG tablet Take 10 mg by mouth 3 (three) times daily. FOR HIVES   Yes [provider]    Family History Family History  Problem Relation Age of Onset  . Diabetes Mother   . Cancer Mother [id number]       2003; BREAST  . Diverticulosis Mother   .  Hyperlipidemia Father     Social History Social History  Substance Use Topics  . Smoking status: Never Smoker  . Smokeless tobacco: Never Used  . Alcohol use No     Comment: RARE     Allergies   Cymbalta [duloxetine hcl]; Propoxyphene; and Darvocet [propoxyphene n-acetaminophen]   Review of Systems Review of Systems  Constitutional: Positive for fatigue. Negative for fever.  HENT: Negative for sore throat.   Eyes: Positive for visual disturbance.  Respiratory: Negative for cough and shortness of breath.   Cardiovascular: Negative for chest pain.  Gastrointestinal: Positive for nausea. Negative for abdominal pain, diarrhea and vomiting.  Genitourinary: Negative  for difficulty urinating.  Musculoskeletal: Negative for back pain and neck pain.  Skin: Negative for rash.  Neurological: Positive for light-headedness. Negative for dizziness, syncope, facial asymmetry, speech difficulty, weakness, numbness (brief tingling in right arm) and headaches.     Physical Exam Updated Vital Signs BP 111/64 (BP Location: Right Arm)   Pulse 63   Temp 97.8 F (36.6 C) (Oral)   Resp 18   Ht  (1.626 m)   Wt 63.5 kg (140 lb)   LMP 07/06/2017   SpO2 98%   BMI 24.03 kg/m   Physical Exam  Constitutional: She is oriented to person, place, and time. She appears well-developed and well-nourished. No distress.  HENT:  Head: Normocephalic and atraumatic.  Eyes: Conjunctivae and EOM are normal.  Neck: Normal range of motion.  Cardiovascular: Normal rate, regular rhythm, normal heart sounds and intact distal pulses.  Exam reveals no gallop and no friction rub.   No murmur heard. Pulmonary/Chest: Effort normal and breath sounds normal. No respiratory distress. She has no wheezes. She has no rales.  Abdominal: Soft. She exhibits no distension. There is no tenderness. There is no guarding.  Musculoskeletal: She exhibits no edema or tenderness.  Neurological: She is alert and oriented to person, place, and time. She has normal strength. No cranial nerve deficit or sensory deficit. Coordination and gait normal. GCS eye subscore is 4. GCS verbal subscore is 5. GCS motor subscore is 6.  Skin: Skin is warm and dry. No rash noted. She is not diaphoretic. No erythema.  Nursing note and vitals reviewed.    ED Treatments / Results  Labs (all labs ordered are listed, but only abnormal results are displayed) Labs Reviewed  BASIC METABOLIC PANEL - Abnormal; Notable for the following:       Result Value   Chloride 99 (*)    All other components within normal limits  URINALYSIS, ROUTINE W REFLEX MICROSCOPIC - Abnormal; Notable for the following:    Color, Urine STRAW  (*)    All other components within normal limits  CBC WITH DIFFERENTIAL/PLATELET  CBG MONITORING, ED  I-STAT BETA HCG BLOOD, ED ([facility], [facility], [facility] ONLY)  CBG MONITORING, ED  POC URINE PREG, ED  CBG MONITORING, ED    EKG  EKG Interpretation  Date/Time:  Thursday July 16 2017 04:17:05 EDT Ventricular Rate:  71 PR Interval:  112 QRS Duration: 86 QT Interval:  410 QTC Calculation: 445 R Axis:   75 Text Interpretation:  Normal sinus rhythm Normal ECG No significant change since last tracing Confirmed by Alvira Monday ([id number]) on 07/16/2017 4:41:26 AM       Radiology No results found.  Procedures Procedures (including critical care time)  Medications Ordered in ED Medications - No data to display   Visual Acuity  Right Eye Distance: 20/40 Left Eye Distance: 20/50  Bilateral Distance: 20/32  Right Eye Near:   Left Eye Near:    Bilateral Near:      Initial Impression / Assessment and Plan / ED Course  I have reviewed the triage vital signs and the nursing notes.  Pertinent labs & imaging results that were available during my care of the patient were reviewed by me and considered in my medical decision making (see chart for details).    31yo female with history of fibromyalgia, interstitial cystitis, migraines, who presents with concern for fatigue, lightheadedness and nausea.  EKG without acute abnormalities. Pregnancy test negative. No anemia. No leukocytosis or signs of sepsis. No CP or dyspnea, doubt PE or ACS.  Normal neurologic exam. Doubt brief right sided tingling described at home represents TIA given no associated neurologic symptoms, brief duration, no CVA risk factors, and feel this is appropriate for outpatient follow up.  Urinalysis shows no sign of infection.  Patient concerned regarding glucose measurements at home. She reports glucose of 50 with improvement to 123 and drop to 70. Feel she is exhibiting normal insulin response.  Her glucose remained within normal  range in ED. Borderline glucose of 50 may be secondary to restricted diet. Do not feel she is exhibiting signs of significant hypoglycemia, and doubt insulinoma or other inappropriate insulin release. Symptoms may be secondary to dehydration. Recommend close PCP Follow up. Patient discharged in stable condition with understanding of reasons to return.   Final Clinical Impressions(s) / ED Diagnoses   Final diagnoses:  Lightheadedness    New Prescriptions Discharge Medication List as of 07/16/2017  5:22 AM       Alvira Monday, MD 07/16/17 1513

## 2017-07-16 NOTE — ED Triage Notes (Signed)
Patient up and moving around in waiting room, talking to another patient. NAD noted, reports feeling better after having the glucose tablet and crackers.

## 2017-07-16 NOTE — ED Notes (Signed)
Reviewed d/c information with pt. Unable to obtain signature from pt d/t malfunctioning signature pad on WOW. Pt departed in NAD, refused use of wheelchair.

## 2017-11-01 ENCOUNTER — Encounter (HOSPITAL_COMMUNITY): Payer: Self-pay

## 2017-11-01 ENCOUNTER — Inpatient Hospital Stay (HOSPITAL_COMMUNITY)
Admission: AD | Admit: 2017-11-01 | Discharge: 2017-11-01 | Disposition: A | Discharge status: Home / Self Care | Payer: Medicaid Other | Source: Ambulatory Visit | Attending: Obstetrics & Gynecology | Admitting: Obstetrics & Gynecology

## 2017-11-01 DIAGNOSIS — R1030 Lower abdominal pain, unspecified: Secondary | ICD-10-CM | POA: Diagnosis present

## 2017-11-01 DIAGNOSIS — N926 Irregular menstruation, unspecified: Secondary | ICD-10-CM

## 2017-11-01 DIAGNOSIS — N979 Female infertility, unspecified: Secondary | ICD-10-CM | POA: Diagnosis not present

## 2017-11-01 DIAGNOSIS — M545 Low back pain: Secondary | ICD-10-CM | POA: Diagnosis not present

## 2017-11-01 DIAGNOSIS — Z888 Allergy status to other drugs, medicaments and biological substances status: Secondary | ICD-10-CM | POA: Insufficient documentation

## 2017-11-01 DIAGNOSIS — N910 Primary amenorrhea: Secondary | ICD-10-CM | POA: Diagnosis not present

## 2017-11-01 DIAGNOSIS — Z803 Family history of malignant neoplasm of breast: Secondary | ICD-10-CM | POA: Insufficient documentation

## 2017-11-01 DIAGNOSIS — R102 Pelvic and perineal pain: Secondary | ICD-10-CM | POA: Insufficient documentation

## 2017-11-01 DIAGNOSIS — K219 Gastro-esophageal reflux disease without esophagitis: Secondary | ICD-10-CM | POA: Diagnosis not present

## 2017-11-01 DIAGNOSIS — M797 Fibromyalgia: Secondary | ICD-10-CM | POA: Diagnosis not present

## 2017-11-01 DIAGNOSIS — Z833 Family history of diabetes mellitus: Secondary | ICD-10-CM | POA: Insufficient documentation

## 2017-11-01 LAB — URINALYSIS, ROUTINE W REFLEX MICROSCOPIC
BILIRUBIN URINE: NEGATIVE
Bacteria, UA: NONE SEEN
Glucose, UA: NEGATIVE mg/dL
Hgb urine dipstick: NEGATIVE
Ketones, ur: NEGATIVE mg/dL
LEUKOCYTES UA: NEGATIVE
NITRITE: NEGATIVE
PH: 5 (ref 5.0–8.0)
Protein, ur: 30 mg/dL — AB
SPECIFIC GRAVITY, URINE: 1.031 — AB (ref 1.005–1.030)

## 2017-11-01 LAB — WET PREP, GENITAL
Clue Cells Wet Prep HPF POC: NONE SEEN
SPERM: NONE SEEN
Trich, Wet Prep: NONE SEEN
YEAST WET PREP: NONE SEEN

## 2017-11-01 LAB — POCT PREGNANCY, URINE: Preg Test, Ur: NEGATIVE

## 2017-11-01 MED ORDER — IBUPROFEN 600 MG PO TABS: 600.0000 mg | ORAL_TABLET | Freq: Four times a day (QID) | ORAL | 1 refills | Status: DC | PRN

## 2017-11-01 NOTE — MAU Note (Signed)
Patient presents with abdominal pain and back pain and missed period.

## 2017-11-01 NOTE — MAU Provider Note (Signed)
Chief Complaint:  Abdominal Cramping; Back Pain; and Amenorrhea   First Provider Initiated Contact with Patient 11/01/17 1401       HPI: Karen Meyer is a 31 y.o. G3P1011 who presents to maternity admissions reporting lower abdominal pain and low back pain.  Missed her period by about a week.  Wants STD testing.. She reports no vaginal bleeding,  vaginal itching/burning, urinary symptoms, h/a, dizziness, n/v, or fever/chills.    Abdominal Cramping  This is a new problem. The current episode started in the past 7 days. The onset quality is gradual. The problem occurs intermittently. The problem has been unchanged. Pertinent negatives include no constipation, diarrhea, dysuria, fever, nausea or vomiting. Nothing aggravates the pain. The pain is relieved by nothing. She has tried nothing for the symptoms.  Back Pain  This is a new problem. The current episode started in the past 7 days. The problem is unchanged. The pain is present in the lumbar spine. The quality of the pain is described as aching. Stiffness is present all day. Associated symptoms include abdominal pain. Pertinent negatives include no dysuria, fever, paresis, paresthesias or tingling.    RN Note: Patient presents with abdominal pain and back pain and missed period.    Past Medical History: Past Medical History:  Diagnosis Date  . Chronic pruritus   . Fibromyalgia   . Frequency of urination   . GERD (gastroesophageal reflux disease)   . Infertility, female   . Interstitial cystitis   . Migraine headache   . Mild acid reflux   . Nocturia   . PONV (postoperative nausea and vomiting)   . TMJ syndrome LEFT SIDE  . Urgency of urination     Past obstetric history: OB History  Gravida Para Term Preterm AB Living  3 1 1  0 1 1  SAB TAB Ectopic Multiple Live Births  1 0 0 0      # Outcome Date GA Lbr Len/2nd Weight Sex Delivery Anes PTL Lv  3 Gravida           2 SAB           1 Term               Past  Surgical History: Past Surgical History:  Procedure Laterality Date  . CESAREAN SECTION  2007  . CYSTO WITH HYDRODISTENSION  08/16/2012   Procedure: CYSTOSCOPY/HYDRODISTENSION;  Surgeon: Valetta Fulleravid S Grapey, MD;  [facility];  Service: Urology;  Laterality: N/A;  . CYSTO/ HOD/ INSTILLATION CLORPACTIN  03-10-2011;  MAR 2011;  2009   FOR [facility].    Family History: Family History  Problem Relation Age of Onset  . Diabetes Mother   . Cancer Mother [id number]       2003; BREAST  . Diverticulosis Mother   . Hyperlipidemia Father     Social History: Social History   Tobacco Use  . Smoking status: Never Smoker  . Smokeless tobacco: Never Used  Substance Use Topics  . Alcohol use: No    Comment: RARE  . Drug use: No    Allergies:  Allergies  Allergen Reactions  . Cymbalta [Duloxetine Hcl] Shortness Of Breath    Difficulty Breathing   . Propoxyphene Hives  . Darvocet [Propoxyphene N-Acetaminophen] Hives    Meds:  Medications Prior to Admission  Medication Sig Dispense Refill Last Dose  . cetirizine (ZYRTEC) 10 MG tablet Take 10 mg by mouth 3 (three) times daily. FOR HIVES   07/15/2017 at Unknown  time    I have reviewed patient's Past Medical Hx, Surgical Hx, Family Hx, Social Hx, medications and allergies.  ROS:  Review of Systems  Constitutional: Negative for fever.  Gastrointestinal: Positive for abdominal pain. Negative for constipation, diarrhea, nausea and vomiting.  Genitourinary: Negative for dysuria.  Musculoskeletal: Positive for back pain.  Neurological: Negative for tingling and paresthesias.   Other systems negative     Physical Exam   Patient Vitals for the past 24 hrs:  BP Temp Pulse Resp Height Weight  11/01/17 1256 136/68 98.2 F (36.8 C) 70 16 5\' 4"  (1.626 m) 142 lb (64.4 kg)   Constitutional: Well-developed, well-nourished female in no acute distress.  Cardiovascular: normal rate and rhythm, no ectopy audible, S1 & S2 heard, no  murmur Respiratory: normal effort, no distress. Lungs CTAB with no wheezes or crackles GI: Abd soft, non-tender.  Nondistended.  No rebound, No guarding.  Bowel Sounds audible  MS: Extremities nontender, no edema, normal ROM Neurologic: Alert and oriented x 4.   Grossly nonfocal. GU: Neg CVAT. Skin:  Warm and Dry Psych:  Affect appropriate.  PELVIC EXAM: Cervix pink, visually closed, without lesion, scant white creamy discharge, vaginal walls and external genitalia normal Bimanual exam: Cervix firm, anterior, neg CMT, uterus nontender, nonenlarged, adnexa without tenderness, enlargement, or mass    Labs: Results for orders placed or performed during the hospital encounter of 11/01/17 (from the past 24 hour(s))  Urinalysis, Routine w reflex microscopic     Status: Abnormal   Collection Time: 11/01/17 12:58 PM  Result Value Ref Range   Color, Urine YELLOW YELLOW   APPearance CLEAR CLEAR   Specific Gravity, Urine 1.031 (H) 1.005 - 1.030   pH 5.0 5.0 - 8.0   Glucose, UA NEGATIVE NEGATIVE mg/dL   Hgb urine dipstick NEGATIVE NEGATIVE   Bilirubin Urine NEGATIVE NEGATIVE   Ketones, ur NEGATIVE NEGATIVE mg/dL   Protein, ur 30 (A) NEGATIVE mg/dL   Nitrite NEGATIVE NEGATIVE   Leukocytes, UA NEGATIVE NEGATIVE   RBC / HPF 0-5 0 - 5 RBC/hpf   WBC, UA 0-5 0 - 5 WBC/hpf   Bacteria, UA NONE SEEN NONE SEEN   Squamous Epithelial / LPF 0-5 (A) NONE SEEN   Mucus PRESENT   Pregnancy, urine POC     Status: None   Collection Time: 11/01/17  1:05 PM  Result Value Ref Range   Preg Test, Ur NEGATIVE NEGATIVE  Wet prep, genital     Status: Abnormal   Collection Time: 11/01/17  1:15 PM  Result Value Ref Range   Yeast Wet Prep HPF POC NONE SEEN NONE SEEN   Trich, Wet Prep NONE SEEN NONE SEEN   Clue Cells Wet Prep HPF POC NONE SEEN NONE SEEN   WBC, Wet Prep HPF POC FEW (A) NONE SEEN   Sperm NONE SEEN       Imaging:  No results found.  MAU Course/MDM: I have ordered labs as follows: cultures,  wet prep, UA, UPT Imaging ordered: none Results reviewed. No evidence of UTI or other infection.  Not acutely ill.  Exam does not indicate an acute abdomen. Discussed late menses are likely due to ovulatory dysfunction and will likely resolve next month.  If still does not have menses by next week advised doing Home UPT again. Cramping may be dysmenorrhea, pending menses     Treatments in MAU included none.   Pt stable at time of discharge.  Assessment: Pelvic pain - Plan: Discharge patient  Late  menses - Plan: Discharge patient    Plan: Discharge home Recommend Repeat UPT next week if no menses Rx sent for ibuprofen for cramping  Encouraged to return here or to other Urgent Care/ED if she develops worsening of symptoms, increase in pain, fever, or other concerning symptoms.   Wynelle Bourgeois CNM, MSN Certified Nurse-Midwife 11/01/2017 2:01 PM

## 2017-11-01 NOTE — Discharge Instructions (Signed)
Abnormal Uterine Bleeding Abnormal uterine bleeding can affect women at various stages in life, including teenagers, women in their reproductive years, pregnant women, and women who have reached menopause. Several kinds of uterine bleeding are considered abnormal, including:  Bleeding or spotting between periods.  Bleeding after sexual intercourse.  Bleeding that is heavier or more than normal.  Periods that last longer than usual.  Bleeding after menopause.  Many cases of abnormal uterine bleeding are minor and simple to treat, while others are more serious. Any type of abnormal bleeding should be evaluated by your health care provider. Treatment will depend on the cause of the bleeding. Follow these instructions at home: Monitor your condition for any changes. The following actions may help to alleviate any discomfort you are experiencing:  Avoid the use of tampons and douches as directed by your health care provider.  Change your pads frequently.  You should get regular pelvic exams and Pap tests. Keep all follow-up appointments for diagnostic tests as directed by your health care provider. Contact a health care provider if:  Your bleeding lasts more than 1 week.  You feel dizzy at times. Get help right away if:  You pass out.  You are changing pads every 15 to 30 minutes.  You have abdominal pain.  You have a fever.  You become sweaty or weak.  You are passing large blood clots from the vagina.  You start to feel nauseous and vomit. This information is not intended to replace advice given to you by your health care provider. Make sure you discuss any questions you have with your health care provider. Document Released: 10/27/2005 Document Revised: 04/09/2016 Document Reviewed: 05/26/2013 Elsevier Interactive Patient Education  2017 Elsevier Inc.  Menstruation Menstruation is the monthly passing of blood, tissue, fluid, and mucus. It is also known as a period. Your  body is shedding the lining of the uterus. The flow of blood usually occurs during 3-7 consecutive days each month. Hormones control the menstrual cycle. Hormones are a chemical substance produced by endocrine glands in the body to regulate different bodily functions. The first menstrual period may start any time between age 47 years to 16 years. However, it usually starts around age 33 years. Some girls have regular monthly menstrual cycles right from the beginning. However, it is not unusual to have only a couple of drops of blood or spotting when you first start menstruating. It is also not unusual to have two periods a month or miss a month or two when first starting your periods. What are the symptoms?  Mild to moderate abdominal cramps.  Aching or pain in the lower back area. Symptoms may occur 5-10 days before your menstrual period starts. These symptoms are referred to as premenstrual syndrome (PMS). These symptoms can include:  Headache.  Breast tenderness and swelling.  Bloating.  Tiredness (fatigue).  Mood changes.  Craving for certain foods.  These are normal signs and symptoms and can vary in severity. To help relieve these problems, ask your caregiver if you can take over-the-counter medications for pain or discomfort. If the symptoms are not controllable, see your caregiver for help. Hormones involved in menstruation Menstruation comes about because of hormones produced by the pituitary gland in the brain and the ovaries that affect the uterine lining. First, the pituitary gland in the brain produces the hormone follicle stimulating hormone [facility](FSH). FSH stimulates the ovaries to produce estrogen, which thickens the uterine lining and begins to develop an egg in the ovary.  About 14 days later, the pituitary gland produces another hormone called luteinizing hormone (LH). LH causes the egg to come out of a sac in the ovary (ovulation). The empty sac on the ovary called the corpus  luteum is stimulated by another hormone from the pituitary gland called luteotropin. The corpus luteum begins to produce the estrogen and progesterone hormone. The progesterone hormone prepares the lining of the uterus to have the fertilized egg (egg combined with sperm) attach to the lining of the uterus and begin to develop into a fetus. If the egg is not fertilized, the corpus luteum stops producing estrogen and progesterone, it disappears, the lining of the uterus sloughs off and a menstrual period begins. Then the menstrual cycle starts all over again and will continue monthly unless pregnancy occurs or menopause begins. The secretion of hormones is complex. Various parts of the body become involved in many chemical activities. Female sex hormones have other functions in a woman's body as well. Estrogen increases a woman's sex drive (libido). It naturally helps body get rid of fluids (diuretic). It also aids in the process of building new bone. Therefore, maintaining hormonal health is essential to all levels of a woman's well being. These hormones are usually present in normal amounts and cause you to menstruate. It is the relationship between the (small) levels of the hormones that is critical. When the balance is upset, menstrual irregularities can occur. How does the menstrual cycle happen?  Menstrual cycles vary in length from 21-35 days with an average of 29 days. The cycle begins on the first day of bleeding. At this time, the pituitary gland in the brain releases FSH that travels through the bloodstream to the ovaries. The [facility] stimulates the follicles in the ovaries. This prepares the body for ovulation that occurs around the 14th day of the cycle. The ovaries produce estrogen, and this makes sure conditions are right in the uterus for implantation of the fertilized egg.  When the levels of estrogen reach a high enough level, it signals the gland in the brain (pituitary gland) to release a surge  of LH. This causes the release of the ripest egg from its follicle (ovulation). Usually only one follicle releases one egg, but sometimes more than one follicle releases an egg especially when stimulating the ovaries for in vitro fertilization. The egg can then be collected by either fallopian tube to await fertilization. The burst follicle within the ovary that is left behind is now called the corpus luteum or "yellow body." The corpus luteum continues to give off (secrete) reduced amounts of estrogen. This closes and hardens the cervix. It dries up the mucus to the naturally infertile condition.  The corpus luteum also begins to give off greater amounts of progesterone. This causes the lining of the uterus (endometrium) to thicken even more in preparation for the fertilized egg. The egg is starting to journey down from the fallopian tube to the uterus. It also signals the ovaries to stop releasing eggs. It assists in returning the cervical mucus to its infertile state.  If the egg implants successfully into the womb lining and pregnancy occurs, progesterone levels will continue to raise. It is often this hormone that gives some pregnant women a feeling of well being, like a "natural high." Progesterone levels drop again after childbirth.  If fertilization does not occur, the corpus luteum dies, stopping the production of hormones. This sudden drop in progesterone causes the uterine lining to break down, accompanied by blood (menstruation).  This starts the cycle back at day 1. The whole process starts all over again. Woman go through this cycle every month from puberty to menopause. Women have breaks only for pregnancy and breastfeeding (lactation), unless the woman has health problems that affect the female hormone system or chooses to use oral contraceptives to have unnatural menstrual periods. Follow these instructions at home:  Keep track of your periods by using a calendar.  If you use tampons,  get the least absorbent to avoid toxic shock syndrome.  Do not leave tampons in the vagina over night or longer than 6 hours.  Wear a sanitary pad over night.  Exercise 3-5 times a week or more.  Avoid foods and drinks that you know will make your symptoms worse before or during your period. Contact a health care provider if:  You develop a fever with your period.  Your periods are lasting more than 7 days.  Your period is so heavy that you have to change pads or tampons every 30 minutes.  You develop clots with your period and never had clots before.  You cannot get relief from over-the-counter medication for your symptoms.  Your period has not started, and it has been longer than 35 days. This information is not intended to replace advice given to you by your health care provider. Make sure you discuss any questions you have with your health care provider. Document Released: 10/17/2002 Document Revised: 04/03/2016 Document Reviewed: 05/26/2013 Elsevier Interactive Patient Education  2017 [employer]

## 2017-11-02 LAB — GC/CHLAMYDIA PROBE AMP (~~LOC~~) NOT AT ARMC
Chlamydia: NEGATIVE
Neisseria Gonorrhea: NEGATIVE

## 2018-01-25 ENCOUNTER — Encounter (HOSPITAL_BASED_OUTPATIENT_CLINIC_OR_DEPARTMENT_OTHER): Payer: Self-pay | Admitting: Emergency Medicine

## 2018-01-25 ENCOUNTER — Emergency Department (HOSPITAL_BASED_OUTPATIENT_CLINIC_OR_DEPARTMENT_OTHER): Payer: Medicaid Other

## 2018-01-25 ENCOUNTER — Emergency Department (HOSPITAL_BASED_OUTPATIENT_CLINIC_OR_DEPARTMENT_OTHER)
Admission: EM | Admit: 2018-01-25 | Discharge: 2018-01-25 | Disposition: A | Discharge status: Home / Self Care | Payer: Medicaid Other | Attending: Emergency Medicine | Admitting: Emergency Medicine

## 2018-01-25 ENCOUNTER — Other Ambulatory Visit: Payer: Self-pay

## 2018-01-25 DIAGNOSIS — Z79899 Other long term (current) drug therapy: Secondary | ICD-10-CM | POA: Diagnosis not present

## 2018-01-25 DIAGNOSIS — S76212A Strain of adductor muscle, fascia and tendon of left thigh, initial encounter: Secondary | ICD-10-CM | POA: Insufficient documentation

## 2018-01-25 DIAGNOSIS — Y939 Activity, unspecified: Secondary | ICD-10-CM | POA: Insufficient documentation

## 2018-01-25 DIAGNOSIS — X58XXXA Exposure to other specified factors, initial encounter: Secondary | ICD-10-CM | POA: Insufficient documentation

## 2018-01-25 DIAGNOSIS — Y999 Unspecified external cause status: Secondary | ICD-10-CM | POA: Diagnosis not present

## 2018-01-25 DIAGNOSIS — Y929 Unspecified place or not applicable: Secondary | ICD-10-CM | POA: Diagnosis not present

## 2018-01-25 DIAGNOSIS — M79605 Pain in left leg: Secondary | ICD-10-CM | POA: Diagnosis present

## 2018-01-25 MED ORDER — ACETAMINOPHEN 325 MG PO TABS
650.0000 mg | ORAL_TABLET | Freq: Once | ORAL | Status: AC
  Administered 2018-01-25: 650 mg via ORAL
  Filled 2018-01-25: qty 2

## 2018-01-25 MED ORDER — IBUPROFEN 800 MG PO TABS
800.0000 mg | ORAL_TABLET | Freq: Once | ORAL | Status: AC
  Administered 2018-01-25: 800 mg via ORAL
  Filled 2018-01-25: qty 1

## 2018-01-25 NOTE — ED Triage Notes (Signed)
Patient reports left groin pain for 2 weeks.  Reports unsure of injury but states walking on this has made it worse.

## 2018-01-25 NOTE — ED Provider Notes (Signed)
[facility] EMERGENCY DEPARTMENT Provider Note   CSN: [id number] Arrival date & time: 01/25/18  0802     History   Chief Complaint Chief Complaint  Patient presents with  . Groin Pain    HPI Karen Meyer is a 32 y.o. female.  The history is provided by the patient.  Groin Pain  This is a new problem. Episode onset: 2.5 weeks ago. The problem occurs constantly. The problem has been gradually worsening. Pertinent negatives include no chest pain, no abdominal pain and no shortness of breath. Associated symptoms comments: Pain with walking and moving the leg certain ways.  No sure if she has hx of DVT due to multiple chronic conditions such as chronic leukopenia, chronic pruritus, interstitial cystitis and fibromyalgia.  She does not take any medications regularly.  She does recall may be slipping and catching herself a few weeks ago before the pain started but she did not fall to the ground.  She has had intermittent pains in this area before but never lasting this long.  No swelling, fever or redness to the area.  No nausea, vomiting or abdominal pain.  She has been putting icy hot on it without significant improvement and took one aspirin last night.. The symptoms are aggravated by walking (Externally rotating the hip makes it much worse). The treatment provided no relief.    Past Medical History:  Diagnosis Date  . Chronic pruritus   . Fibromyalgia   . Frequency of urination   . GERD (gastroesophageal reflux disease)   . Infertility, female   . Interstitial cystitis   . Migraine headache   . Mild acid reflux   . Nocturia   . PONV (postoperative nausea and vomiting)   . TMJ syndrome LEFT SIDE  . Urgency of urination     Patient Active Problem List   Diagnosis Date Noted  . Pregnancy with uncertain fetal viability 11/29/2015  . LEUKOPENIA, CHRONIC 08/08/2010  . APHTHOUS STOMATITIS 08/08/2010  . MYALGIA 08/08/2010  . CHANGE IN BOWELS 08/08/2010  .  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 TMJ SYNDROME 07/31/2010  . GERD 07/31/2010  . UTI'S, HX OF 07/31/2010  . CYSTITIS, HX OF 07/31/2010  . THRUSH 07/30/2010    Past Surgical History:  Procedure Laterality Date  . CESAREAN SECTION  2007  . CYSTO WITH HYDRODISTENSION  08/16/2012   Procedure: CYSTOSCOPY/HYDRODISTENSION;  Surgeon: Valetta Fulleravid S Grapey, MD;  [facility];  Service: Urology;  Laterality: N/A;  . CYSTO/ HOD/ INSTILLATION CLORPACTIN  03-10-2011;  MAR 2011;  2009   FOR I.C.    OB History    Gravida Para Term Preterm AB Living   3 1 1  0 1 1   SAB TAB Ectopic Multiple Live Births   1 0 0 0         Home Medications    Prior to Admission medications   Medication Sig Start Date End Date Taking? Authorizing Provider  cetirizine (ZYRTEC) 10 MG tablet Take 10 mg by mouth 3 (three) times daily. FOR HIVES    [provider]  ibuprofen (ADVIL,MOTRIN) 600 MG tablet Take 1 tablet (600 mg total) by mouth every 6 (six) hours as needed. 11/01/17   Aviva SignsWilliams, Marie L, CNM  norgestimate-ethinyl estradiol (SPRINTEC 28) 0.25-35 MG-MCG tablet Take 1 tablet by mouth daily. Patient not taking: Reported on 12/16/2015 12/11/15 12/16/15  Aviva SignsWilliams, Marie L, CNM    Family History Family History  Problem Relation Age of Onset  . Diabetes Mother   .  Cancer Mother 48       2003; BREAST  . Diverticulosis Mother   . Hyperlipidemia Father     Social History Social History   Tobacco Use  . Smoking status: Never Smoker  . Smokeless tobacco: Never Used  Substance Use Topics  . Alcohol use: No    Comment: RARE  . Drug use: No     Allergies   Cymbalta [duloxetine hcl]; Propoxyphene; and Darvocet [propoxyphene n-acetaminophen]   Review of Systems Review of Systems  Respiratory: Negative for shortness of breath.   Cardiovascular: Negative for chest pain.  Gastrointestinal: Negative for abdominal pain.  All other systems reviewed and are negative.    Physical  Exam Updated Vital Signs BP 111/68 (BP Location: Right Arm)   Pulse 63   Temp 98 F (36.7 C) (Oral)   Resp 18   Ht 5\' 4"  (1.626 m)   Wt 64.9 kg (143 lb)   LMP 12/30/2017 (Approximate)   SpO2 100%   BMI 24.55 kg/m   Physical Exam  Constitutional: She is oriented to person, place, and time. She appears well-developed and well-nourished. No distress.  HENT:  Head: Normocephalic and atraumatic.  Eyes: EOM are normal. Pupils are equal, round, and reactive to light.  Cardiovascular: Normal rate.  Pulmonary/Chest: Effort normal.  Musculoskeletal:       Left hip: She exhibits normal strength, no bony tenderness and no swelling.       Legs: Neurological: She is alert and oriented to person, place, and time. She has normal strength. No sensory deficit.  Nursing note and vitals reviewed.    ED Treatments / Results  Labs (all labs ordered are listed, but only abnormal results are displayed) Labs Reviewed - No data to display  EKG  EKG Interpretation None       Radiology US Venous Img Lower  Left (dvt Study)  Result Date: 01/25/2018 CLINICAL DATA:  Left lower extremity pain for the past 2 weeks. Evaluate for DVT. EXAM: LEFT LOWER EXTREMITY VENOUS DOPPLER ULTRASOUND TECHNIQUE: Gray-scale sonography with graded compression, as well as color Doppler and duplex ultrasound were performed to evaluate the lower extremity deep venous systems from the level of the common femoral vein and including the common femoral, femoral, profunda femoral, popliteal and calf veins including the posterior tibial, peroneal and gastrocnemius veins when visible. The superficial great saphenous vein was also interrogated. Spectral Doppler was utilized to evaluate flow at rest and with distal augmentation maneuvers in the common femoral, femoral and popliteal veins. COMPARISON:  None. FINDINGS: Contralateral Common Femoral Vein: Respiratory phasicity is normal and symmetric with the symptomatic side. No evidence  of thrombus. Normal compressibility. Common Femoral Vein: No evidence of thrombus. Normal compressibility, respiratory phasicity and response to augmentation. Saphenofemoral Junction: No evidence of thrombus. Normal compressibility and flow on color Doppler imaging. Profunda Femoral Vein: No evidence of thrombus. Normal compressibility and flow on color Doppler imaging. Femoral Vein: No evidence of thrombus. Normal compressibility, respiratory phasicity and response to augmentation. Popliteal Vein: No evidence of thrombus. Normal compressibility, respiratory phasicity and response to augmentation. Calf Veins: Appear patent where imaged. Superficial Great Saphenous Vein: No evidence of thrombus. Normal compressibility. Venous Reflux:  None. Other Findings:  None. IMPRESSION: No evidence of DVT within the left lower extremity. Electronically Signed   By: Simonne Come M.D.   On: 01/25/2018 09:50    Procedures Procedures (including critical care time)  Medications Ordered in ED Medications  ibuprofen (ADVIL,MOTRIN) tablet 800 mg (800 mg Oral  Given 01/25/18 0839)  acetaminophen (TYLENOL) tablet 650 mg (650 mg Oral Given 01/25/18 0839)     Initial Impression / Assessment and Plan / ED Course  I have reviewed the triage vital signs and the nursing notes.  Pertinent labs & imaging results that were available during my care of the patient were reviewed by me and considered in my medical decision making (see chart for details).     Patient presenting with medial thigh pain which is most likely musculoskeletal in nature however given patient's chronic conditions and location of pain will r/o dvt.  U/S pending and pt given Ibuprofen and tylenol for pain.  Pt has no signs of cellulitis, abscess and low suspicion for septic hip.  10:28 AM Ultrasound is negative for DVT.  Will treat as muscle strain.   Final Clinical Impressions(s) / ED Diagnoses   Final diagnoses:  Groin strain, left, initial encounter     ED Discharge Orders    None       Gwyneth Sprout, MD 01/25/18 1028

## 2018-06-01 ENCOUNTER — Telehealth: Payer: Self-pay | Admitting: Family Medicine

## 2018-06-01 NOTE — Telephone Encounter (Signed)
Attempted to call pt today to let her know that her immunization records are ready for pick up at [address]  Cleda MccreedyVoicemail was not set up so I could not leave a message.

## 2018-06-02 ENCOUNTER — Ambulatory Visit (INDEPENDENT_AMBULATORY_CARE_PROVIDER_SITE_OTHER): Payer: Medicaid Other | Admitting: Physician Assistant

## 2018-06-02 DIAGNOSIS — Z111 Encounter for screening for respiratory tuberculosis: Secondary | ICD-10-CM | POA: Diagnosis not present

## 2018-06-02 NOTE — Progress Notes (Signed)
  Tuberculosis Risk Questionnaire  1. No Were you born outside the [place] in one of the following parts of the world: [place], [place], [place], [place] America or Eastern [place]?    2. No Have you traveled outside the [place] and lived for more than one month in one of the following parts of the world: [place], [place], [place], [place] or Eastern [place]?    3. No Do you have a compromised immune system such as from any of the following conditions:HIV/AIDS, organ or bone marrow transplantation, diabetes, immunosuppressive medicines (e.g. Prednisone, Remicaide), leukemia, lymphoma, cancer of the head or neck, gastrectomy or jejunal bypass, end-stage renal disease (on dialysis), or silicosis?     4. Yes  Have you ever or do you plan on working in: a residential care center, a health care facility, a jail or prison or homeless shelter?    5. No Have you ever: injected illegal drugs, used crack cocaine, lived in a homeless shelter  or been in jail or prison?     6. No Have you ever been exposed to anyone with infectious tuberculosis?  7. No Have you ever had a BCG vaccine? (BCG is a vaccine for tuberculosis  (TB) used in OTHER countries, NOT in the [place]).  8. No Have you ever been advised by a health care provider NOT to have a TB skin test?  9. No Have you ever had a POSITIVE TB skin test?  IF SO, when? n/a  IF SO, were you treated with INH? n/a  IF SO, where? n/a  Tuberculosis Symptom Questionnaire  Do you currently have any of the following symptoms?  1. No Unexplained cough lasting more than 3 weeks?   2. No Unexplained fever lasting more than 3 weeks.   3. No Night Sweats (sweating that leaves the bedclothes and sheets wet)     4. No Shortness of Breath   5. No Chest Pain   6. No Unintentional weight loss    7. No Unexplained fatigue (very tired for no reason)    

## 2018-06-05 ENCOUNTER — Ambulatory Visit: Payer: Medicaid Other

## 2018-06-05 LAB — TB SKIN TEST
Induration: 0 mm
TB Skin Test: NEGATIVE

## 2018-06-23 ENCOUNTER — Other Ambulatory Visit: Payer: Self-pay

## 2018-06-23 ENCOUNTER — Encounter (HOSPITAL_BASED_OUTPATIENT_CLINIC_OR_DEPARTMENT_OTHER): Payer: Self-pay

## 2018-06-23 ENCOUNTER — Emergency Department (HOSPITAL_BASED_OUTPATIENT_CLINIC_OR_DEPARTMENT_OTHER)
Admission: EM | Admit: 2018-06-23 | Discharge: 2018-06-23 | Disposition: A | Discharge status: Home / Self Care | Payer: Medicaid Other | Attending: Emergency Medicine | Admitting: Emergency Medicine

## 2018-06-23 DIAGNOSIS — R1030 Lower abdominal pain, unspecified: Secondary | ICD-10-CM | POA: Diagnosis present

## 2018-06-23 DIAGNOSIS — N898 Other specified noninflammatory disorders of vagina: Secondary | ICD-10-CM | POA: Diagnosis not present

## 2018-06-23 DIAGNOSIS — Z3202 Encounter for pregnancy test, result negative: Secondary | ICD-10-CM | POA: Diagnosis not present

## 2018-06-23 DIAGNOSIS — Z79899 Other long term (current) drug therapy: Secondary | ICD-10-CM | POA: Insufficient documentation

## 2018-06-23 DIAGNOSIS — R102 Pelvic and perineal pain: Secondary | ICD-10-CM | POA: Diagnosis not present

## 2018-06-23 LAB — CBC
HEMATOCRIT: 38.7 % (ref 36.0–46.0)
Hemoglobin: 13.4 g/dL (ref 12.0–15.0)
MCH: 32.3 pg (ref 26.0–34.0)
MCHC: 34.6 g/dL (ref 30.0–36.0)
MCV: 93.3 fL (ref 78.0–100.0)
Platelets: 250 10*3/uL (ref 150–400)
RBC: 4.15 MIL/uL (ref 3.87–5.11)
RDW: 12.1 % (ref 11.5–15.5)
WBC: 3.6 10*3/uL — AB (ref 4.0–10.5)

## 2018-06-23 LAB — COMPREHENSIVE METABOLIC PANEL
ALBUMIN: 4.4 g/dL (ref 3.5–5.0)
ALT: 16 U/L (ref 0–44)
AST: 21 U/L (ref 15–41)
Alkaline Phosphatase: 35 U/L — ABNORMAL LOW (ref 38–126)
Anion gap: 8 (ref 5–15)
BILIRUBIN TOTAL: 1.2 mg/dL (ref 0.3–1.2)
BUN: 17 mg/dL (ref 6–20)
CHLORIDE: 106 mmol/L (ref 98–111)
CO2: 26 mmol/L (ref 22–32)
CREATININE: 0.73 mg/dL (ref 0.44–1.00)
Calcium: 9.1 mg/dL (ref 8.9–10.3)
GFR calc Af Amer: >60 mL/min (ref >60)
GLUCOSE: 88 mg/dL (ref 70–99)
Potassium: 3.8 mmol/L (ref 3.5–5.1)
Sodium: 140 mmol/L (ref 135–145)
Total Protein: 7.7 g/dL (ref 6.5–8.1)

## 2018-06-23 LAB — URINALYSIS, ROUTINE W REFLEX MICROSCOPIC
Bilirubin Urine: NEGATIVE
GLUCOSE, UA: NEGATIVE mg/dL
Hgb urine dipstick: NEGATIVE
KETONES UR: NEGATIVE mg/dL
LEUKOCYTES UA: NEGATIVE
NITRITE: NEGATIVE
PH: 6 (ref 5.0–8.0)
Protein, ur: NEGATIVE mg/dL
Specific Gravity, Urine: >1.03 — ABNORMAL HIGH (ref 1.005–1.030)

## 2018-06-23 LAB — WET PREP, GENITAL
SPERM: NONE SEEN
TRICH WET PREP: NONE SEEN
YEAST WET PREP: NONE SEEN

## 2018-06-23 LAB — PREGNANCY, URINE: Preg Test, Ur: NEGATIVE

## 2018-06-23 LAB — LIPASE, BLOOD: LIPASE: 28 U/L (ref 11–51)

## 2018-06-23 MED ORDER — CEFTRIAXONE SODIUM 250 MG IJ SOLR
250.0000 mg | Freq: Once | INTRAMUSCULAR | Status: AC
  Administered 2018-06-23: 250 mg via INTRAMUSCULAR
  Filled 2018-06-23: qty 250

## 2018-06-23 MED ORDER — NAPROXEN 375 MG PO TABS: 375.0000 mg | ORAL_TABLET | Freq: Two times a day (BID) | ORAL | 0 refills | Status: DC

## 2018-06-23 MED ORDER — METRONIDAZOLE 500 MG PO TABS: 500.0000 mg | ORAL_TABLET | Freq: Two times a day (BID) | ORAL | 0 refills | Status: AC

## 2018-06-23 MED ORDER — DOXYCYCLINE HYCLATE 100 MG PO CAPS: 100.0000 mg | ORAL_CAPSULE | Freq: Two times a day (BID) | ORAL | 0 refills | Status: DC

## 2018-06-23 MED ORDER — DOXYCYCLINE HYCLATE 100 MG PO TABS
100.0000 mg | ORAL_TABLET | Freq: Once | ORAL | Status: AC
  Administered 2018-06-23: 100 mg via ORAL
  Filled 2018-06-23: qty 1

## 2018-06-23 MED ORDER — METRONIDAZOLE 500 MG PO TABS
500.0000 mg | ORAL_TABLET | Freq: Once | ORAL | Status: AC
  Administered 2018-06-23: 500 mg via ORAL
  Filled 2018-06-23: qty 1

## 2018-06-23 NOTE — ED Notes (Signed)
Pelvic cart set up at bedside  

## 2018-06-23 NOTE — ED Notes (Signed)
ED Provider at bedside. 

## 2018-06-23 NOTE — ED Notes (Signed)
Pt/family verbalized understanding of discharge instructions.   

## 2018-06-23 NOTE — Discharge Instructions (Addendum)
You were treated with one-time dose of a shot, as well as your first dose of pill medications.  He will have a total of 2 weeks of antibiotics.  Doxycycline is an antibiotic that fights infection in the female organs. This medication can make your skin sensitive to the sun, so please ensure that you wear sunscreen, hats, or other coverage over your skin while taking this. This medicine CANNOT be taken by women while pregnant, breastfeeding, or trying to become pregnant.  Please speak with a healthcare provider if any of these situations apply to you.  Flagyl is an antibiotic used to treat bacterial vaginosis. This medication CANNOT be taken with alcohol, because it can cause nausea and vomiting combined with alcohol. Please also refrain from drinking alcohol for 48 hours after you finish the medicine.  Please take all of your antibiotics until finished.   You may develop abdominal discomfort or nausea from the antibiotic. If this occurs, you may take it with food. Some patients also get diarrhea with antibiotics. You may help offset this with probiotics which you can buy or get in yogurt. Do not eat or take the probiotics until 2 hours after your antibiotic. Some women develop vaginal yeast infections after antibiotics. If you develop unusual vaginal discharge after being on this medication, please see your primary care provider.   Some people develop allergies to antibiotics. Symptoms of antibiotic allergy can be mild and include a flat rash and itching. They can also be more serious and include:  ?Hives - Hives are raised, red patches of skin that are usually very itchy.  ?Lip or tongue swelling  ?Trouble swallowing or breathing  ?Blistering of the skin or mouth.  If you have any of these serious symptoms, please seek emergency medical care immediately.  For abdominal discomfort, you may take naproxen (Aleve) 375 mg every 12 hours with Tylenol 650 mg, which were taken every 6 hours.  Use a  condom with every sexual encounter Follow up with your doctor or OBGYN in regards to today's visit.    Regarding her slightly low white blood cell count, you need to follow-up with a primary care provider.  Please see the resources below or go on the back of your Medicaid card for more information to establish primary care.  To find a primary care or specialty doctor please call [phone] or [phone] to access "[facility] Find a Doctor Service."  You may also go on the [facility] website at [email or web address]  There are also multiple [facility] practices throughout the [place] that are frequently accepting new patients. You may find a clinic that is close to your home and contact them.  [facility] - [address] [place] [phone]  [facility] in [place] (also locations in [place] and [place]) - [address] [place] [phone]  [facility] - [address] [phone]   Please return to the ER for worsening symptoms, high fevers or persistent vomiting.  You have been tested for HIV, syphilis, chlamydia and gonorrhea. These results will be available in approximately 3 days. You will be notified if they are positive.   It is very important to practice safe sex and use condoms when sexually active. If your results are positive you need to notify all sexual partners so they can be treated as well. The website https://garcia.[email or web address]://www.dontspreadit.com/ can be used to send anonymous text messages or emails to alert sexual  contacts.   SEEK IMMEDIATE MEDICAL CARE IF:  You develop an oral temperature above 102 F (38.9 C), not controlled by medications or lasting more than 2 days.  You develop an increase in pain.  You develop vaginal bleeding and it is not time for your period.  You develop painful intercourse.

## 2018-06-23 NOTE — ED Triage Notes (Signed)
Pt c/o abd pain, low back pain, and vaginal d/c x 3 days.

## 2018-06-23 NOTE — ED Provider Notes (Signed)
[facility] EMERGENCY DEPARTMENT Provider Note   CSN: [id number] Arrival date & time: 06/23/18  1422     History   Chief Complaint Chief Complaint  Patient presents with  . Vaginal Discharge  . Abdominal Pain    HPI Karen Meyer is a 32 y.o. female.  HPI  Patient is a 32 year old female with a history of fibromyalgia, acid reflux, and interstitial cystitis presenting for lower abdominal cramping, low back cramping, and vaginal discharge.  Patient reports her symptoms began 3 days ago, and is been persistent but not progressively worsening.  Patient reports that the lower abdominal cramping is occurring in a bandlike pattern across lower abdomen without focal nature.  Patient denies any dysuria, urgency, or frequency.  Patient reports that vaginal discharges, and a "clumps" and this is abnormal for her.  Patient denies any fever, chills, nausea, or vomiting.  Patient has a regular bowel movements and constipation at baseline, and this is normal for her.  No menstrual abnormality reported.  Patient reports she is sexually active with one female partner in the last 3 months, and does not believe she could have been exposed to STI, but reports that this is possible.  Abdominal surgical history significant for 1 C-section.  Past Medical History:  Diagnosis Date  . Chronic pruritus   . Fibromyalgia   . Frequency of urination   . GERD (gastroesophageal reflux disease)   . Infertility, female   . Interstitial cystitis   . Migraine headache   . Mild acid reflux   . Nocturia   . PONV (postoperative nausea and vomiting)   . TMJ syndrome LEFT SIDE  . Urgency of urination     Patient Active Problem List   Diagnosis Date Noted  . Pregnancy with uncertain fetal viability 11/29/2015  . LEUKOPENIA, CHRONIC 08/08/2010  . APHTHOUS STOMATITIS 08/08/2010  . MYALGIA 08/08/2010  . CHANGE IN BOWELS 08/08/2010  . 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 TMJ SYNDROME 07/31/2010  . GERD  07/31/2010  . UTI'S, HX OF 07/31/2010  . CYSTITIS, HX OF 07/31/2010  . THRUSH 07/30/2010    Past Surgical History:  Procedure Laterality Date  . CESAREAN SECTION  2007  . CYSTO WITH HYDRODISTENSION  08/16/2012   Procedure: CYSTOSCOPY/HYDRODISTENSION;  Surgeon: Valetta Fulleravid S Grapey, MD;  [facility];  Service: Urology;  Laterality: N/A;  . CYSTO/ HOD/ INSTILLATION CLORPACTIN  03-10-2011;  MAR 2011;  2009   FOR [facility].     OB History    Gravida  3   Para  1   Term  1   Preterm  0   AB  1   Living  1     SAB  1   TAB  0   Ectopic  0   Multiple  0   Live Births               Home Medications    Prior to Admission medications   Medication Sig Start Date End Date Taking? Authorizing Provider  cetirizine (ZYRTEC) 10 MG tablet Take 10 mg by mouth 3 (three) times daily. FOR HIVES    [provider]  ibuprofen (ADVIL,MOTRIN) 600 MG tablet Take 1 tablet (600 mg total) by mouth every 6 (six) hours as needed. 11/01/17   Aviva SignsWilliams, Marie L, CNM    Family History Family History  Problem Relation Age of Onset  . Diabetes Mother   . Cancer Mother [id number]       2003; BREAST  .  Diverticulosis Mother   . Hyperlipidemia Father     Social History Social History   Tobacco Use  . Smoking status: Never Smoker  . Smokeless tobacco: Never Used  Substance Use Topics  . Alcohol use: No    Comment: RARE  . Drug use: No     Allergies   Cymbalta [duloxetine hcl]; Propoxyphene; and Darvocet [propoxyphene n-acetaminophen]   Review of Systems Review of Systems  Constitutional: Negative for chills and fever.  HENT: Negative for congestion and sore throat.   Eyes: Negative for visual disturbance.  Respiratory: Negative for cough, chest tightness and shortness of breath.   Cardiovascular: Negative for chest pain, palpitations and leg swelling.  Gastrointestinal: Positive for abdominal pain and constipation. Negative for diarrhea, nausea and vomiting.   Genitourinary: Positive for vaginal discharge. Negative for dysuria, flank pain, frequency, urgency and vaginal pain.  Musculoskeletal: Negative for back pain and myalgias.  Skin: Negative for rash.  Neurological: Negative for headaches.     Physical Exam Updated Vital Signs BP (!) 132/91 (BP Location: Left Arm)   Pulse 67   Temp 98.3 F (36.8 C) (Oral)   Resp 18   Ht 5\' 4"  (1.626 m)   Wt 63.5 kg   LMP 06/02/2018   SpO2 100%   BMI 24.03 kg/m   Physical Exam  Constitutional: She appears well-developed and well-nourished. No distress.  HENT:  Head: Normocephalic and atraumatic.  Mouth/Throat: Oropharynx is clear and moist.  Eyes: Pupils are equal, round, and reactive to light. Conjunctivae and EOM are normal.  Neck: Normal range of motion. Neck supple.  Cardiovascular: Normal rate, regular rhythm, S1 normal and S2 normal.  No murmur heard. Pulmonary/Chest: Effort normal and breath sounds normal. She has no wheezes. She has no rales.  Abdominal: Soft. Bowel sounds are normal. She exhibits no distension. There is tenderness. There is no rebound and no guarding.  Mild lower abdominal tenderness to palpation without guarding or rebound.  No focal tenderness.  Genitourinary:  Genitourinary Comments: Pelvic examination performed with EMT chaperone present.  Patient has mobile bilateral lymphadenopathy of the horizontal chain of the inguinal region.  No external lesions or vesicles.  Vaginal tissue erythematous and rugated without lesions.  Cervix anteverted, with a small amount of erythema surrounding os.  Thick, milky white discharge surrounding cervical loss and throughout vaginal canal. On bimanual exam, patient exhibits no cervical motion tenderness, or adnexal tenderness.  Musculoskeletal: Normal range of motion. She exhibits no edema or deformity.  Lymphadenopathy:    She has no cervical adenopathy.  Neurological: She is alert.  Cranial nerves grossly intact. Patient moves  extremities symmetrically and with good coordination.  Skin: Skin is warm and dry. No rash noted. No erythema.  Psychiatric: She has a normal mood and affect. Her behavior is normal. Judgment and thought content normal.  Nursing note and vitals reviewed.    ED Treatments / Results  Labs (all labs ordered are listed, but only abnormal results are displayed) Labs Reviewed  LIPASE, BLOOD  COMPREHENSIVE METABOLIC PANEL  CBC  URINALYSIS, ROUTINE W REFLEX MICROSCOPIC  PREGNANCY, URINE    EKG None  Radiology No results found.  Procedures Procedures (including critical care time)  Medications Ordered in ED Medications - No data to display   Initial Impression / Assessment and Plan / ED Course  I have reviewed the triage vital signs and the nursing notes.  Pertinent labs & imaging results that were available during my care of the patient were reviewed by  me and considered in my medical decision making (see chart for details).  Clinical Course as of Jun 23 1744  Wed Jun 23, 2018  1535 Slight leukopenia 3.6.  No history of leukopenia, medical conditions or medications that would cause agranulocytosis or bone marrow suppression. Will have patient follow up and recheck.   CBC(!) [AM]  1628 Will treat for PID including 14 days of Flagyl due to lower abdominal cramping.  Wet prep, genital(!) [AM]    Clinical Course User Index [AM] Elisha Ponder, PA-C    Patient nontoxic-appearing, afebrile, and with benign abdomen.  Differential diagnosis includes PID, cervicitis, urinary tract infection, ectopic pregnancy, lumbago, interstitial cystitis flare.  Repeat evaluations patient in no acute distress.  Lab work demonstrating slight leukopenia, discussed with patient and patient will follow-up for repeat.  Remainder of laboratory analysis without abnormality.  No evidence of urinary tract infection.  Patient is not pregnant.  Wet prep demonstrating many WBCs and clue cells.  Given  symptomatology and wet prep findings, will treat for PID.  Patient is to follow-up with OB/GYN, which she currently has care, and establish primary care.  Patient instructed return for any worsening abdominal pain, intractable nausea or vomiting, fever or chills.  Patient is in understanding and agrees with the plan of care.  This is a supervised visit with Dr. Kennis Carina. Evaluation, management, and discharge planning discussed with this attending physician.  Final Clinical Impressions(s) / ED Diagnoses   Final diagnoses:  Pelvic pain  Vaginal discharge    ED Discharge Orders         Ordered    doxycycline (VIBRAMYCIN) 100 MG capsule  2 times daily     06/23/18 1745    metroNIDAZOLE (FLAGYL) 500 MG tablet  2 times daily     06/23/18 1745    naproxen (NAPROSYN) 375 MG tablet  2 times daily     06/23/18 1745           Delia Chimes 06/23/18 1746    Sabas Sous, MD 06/24/18 [phone]

## 2018-06-24 LAB — GC/CHLAMYDIA PROBE AMP (~~LOC~~) NOT AT ARMC
CHLAMYDIA, DNA PROBE: NEGATIVE
NEISSERIA GONORRHEA: NEGATIVE

## 2018-06-24 LAB — HIV ANTIBODY (ROUTINE TESTING W REFLEX): HIV Screen 4th Generation wRfx: NONREACTIVE

## 2018-06-24 LAB — RPR: RPR: NONREACTIVE

## 2018-06-26 ENCOUNTER — Inpatient Hospital Stay (HOSPITAL_COMMUNITY)
Admission: AD | Admit: 2018-06-26 | Discharge: 2018-06-26 | Disposition: A | Discharge status: Home / Self Care | Payer: Medicaid Other | Source: Ambulatory Visit | Attending: Family Medicine | Admitting: Family Medicine

## 2018-06-26 DIAGNOSIS — Z3202 Encounter for pregnancy test, result negative: Secondary | ICD-10-CM

## 2018-06-26 DIAGNOSIS — Z3200 Encounter for pregnancy test, result unknown: Secondary | ICD-10-CM | POA: Diagnosis present

## 2018-06-26 DIAGNOSIS — R109 Unspecified abdominal pain: Secondary | ICD-10-CM

## 2018-06-26 LAB — URINALYSIS, ROUTINE W REFLEX MICROSCOPIC
BILIRUBIN URINE: NEGATIVE
Glucose, UA: 50 mg/dL — AB
Hgb urine dipstick: NEGATIVE
KETONES UR: NEGATIVE mg/dL
LEUKOCYTES UA: NEGATIVE
NITRITE: NEGATIVE
PROTEIN: NEGATIVE mg/dL
Specific Gravity, Urine: 1.028 (ref 1.005–1.030)
pH: 6 (ref 5.0–8.0)

## 2018-06-26 LAB — POCT PREGNANCY, URINE: Preg Test, Ur: NEGATIVE

## 2018-06-26 LAB — HCG, SERUM, QUALITATIVE: PREG SERUM: NEGATIVE

## 2018-06-26 NOTE — MAU Note (Signed)
Pt was seen in [place] a few days ago, was put on several medications. States she had a positive pregnancy test yesterday but wasn't sure what she could take. States she has ongoing pain from fibromyalgia, but has new abdominal pain 7/10. No bleeding. LMP 06/02/18

## 2018-06-26 NOTE — Discharge Instructions (Signed)
Pregnancy Test Information °What is a pregnancy test? °A pregnancy test is used to detect the presence of human chorionic gonadotropin (hCG) in a sample of your urine or blood. hCG is a hormone produced by the cells of the placenta. The placenta is the organ that forms to nourish and support a developing baby. °This test requires a sample of either blood or urine. A pregnancy test determines whether you are pregnant or not. °How are pregnancy tests done? °Pregnancy tests are done using a home pregnancy test or having a blood or urine test done at your health care provider's office. °Home pregnancy tests require a urine sample. °· Most kits use a plastic testing device with a strip of paper that indicates whether there is hCG in your urine. °· Follow the test instructions very carefully. °· After you urinate on the test stick, markings will appear to let you know whether you are pregnant. °· For best results, use your first urine of the morning. That is when the concentration of hCG is highest. ° °Having a blood test to check for pregnancy requires a sample of blood drawn from a vein in your hand or arm. Your health care provider will send your sample to a lab for testing. Results of a pregnancy test will be positive or negative. °Is one type of pregnancy test better than another? °In some cases, a blood test will return a positive result even if a urine test was negative because blood tests are more sensitive. This means blood tests can detect hCG earlier than home pregnancy tests. °How accurate are home pregnancy tests? °Both types of pregnancy tests are very accurate. °· A blood test is about 98% accurate. °· When you are far enough along in your pregnancy and when used correctly, home pregnancy tests are equally accurate. ° °Can anything interfere with home pregnancy test results °It is possible for certain conditions to cause an inaccurate test result (false positive or false negative). °· A false positive is a  positive test result when you are not pregnant. This can happen if you: °? Are taking certain medicines, including anticonvulsants or tranquilizers. °? Have certain proteins in your blood. °· A false negative is a negative test result when you are pregnant. This can happen if you: °? Took the test before there was enough hCG to detect. A pregnancy test will not be positive in most women until 3-4 weeks after conception. °? Drank a lot of liquid before the test. Diluted urine samples can sometimes give an inaccurate result. °? Take certain medicines, such as water pills (diuretics) or some antihistamines. ° °What should I do if I have a positive pregnancy test? °If you have a positive pregnancy test, schedule an appointment with your health care provider. You might need additional testing to confirm the pregnancy. In the meantime, begin taking a prenatal vitamin, stop smoking, stop drinking alcohol, and do not use street drugs. °Talk to your health care provider about how to take care of yourself during your pregnancy. Ask about what to expect from the care you will need throughout pregnancy (prenatal care). °This information is not intended to replace advice given to you by your health care provider. Make sure you discuss any questions you have with your health care provider. °Document Released: 10/30/2003 Document Revised: 09/23/2016 Document Reviewed: 02/21/2014 °Elsevier Interactive Patient Education © 2018 Elsevier Inc. ° °

## 2018-06-26 NOTE — MAU Provider Note (Addendum)
Patient  Karen BunnellMargiana F Meyer is a 32 y.o. [id number] Non-pregnant female here after having a positive pregnancy test at home yesterday. She was seen at  [facility] on 8-11 and diagnosed with PID and bacterial vaginosis. She had a negative pregnancy test on that day. She was put on doxycycline and flagyl. She is here because she had a positive pregnancy test at home and she is concerned that these meds will affect the baby.  History     CSN: [id number]  Arrival date and time: 06/26/18 1206   None     No chief complaint on file.  HPI  OB History    Gravida  3   Para  1   Term  1   Preterm  0   AB  1   Living  1     SAB  1   TAB  0   Ectopic  0   Multiple  0   Live Births              Past Medical History:  Diagnosis Date  . Chronic pruritus   . Fibromyalgia   . Frequency of urination   . GERD (gastroesophageal reflux disease)   . Infertility, female   . Interstitial cystitis   . Migraine headache   . Mild acid reflux   . Nocturia   . PONV (postoperative nausea and vomiting)   . TMJ syndrome LEFT SIDE  . Urgency of urination     Past Surgical History:  Procedure Laterality Date  . CESAREAN SECTION  2007  . CYSTO WITH HYDRODISTENSION  08/16/2012   Procedure: CYSTOSCOPY/HYDRODISTENSION;  Surgeon: Valetta Fulleravid S Grapey, MD;  [facility];  Service: Urology;  Laterality: N/A;  . CYSTO/ HOD/ INSTILLATION CLORPACTIN  03-10-2011;  MAR 2011;  2009   FOR [facility].    Family History  Problem Relation Age of Onset  . Diabetes Mother   . Cancer Mother 2744       2003; BREAST  . Diverticulosis Mother   . Hyperlipidemia Father     Social History   Tobacco Use  . Smoking status: Never Smoker  . Smokeless tobacco: Never Used  Substance Use Topics  . Alcohol use: No    Comment: RARE  . Drug use: No    Allergies:  Allergies  Allergen Reactions  . Cymbalta [Duloxetine Hcl] Shortness Of Breath    Difficulty Breathing   .  Propoxyphene Hives  . Darvocet [Propoxyphene N-Acetaminophen] Hives    Medications Prior to Admission  Medication Sig Dispense Refill Last Dose  . cetirizine (ZYRTEC) 10 MG tablet Take 10 mg by mouth 3 (three) times daily. FOR HIVES   07/15/2017 at Unknown time  . doxycycline (VIBRAMYCIN) 100 MG capsule Take 1 capsule (100 mg total) by mouth 2 (two) times daily. 27 capsule 0   . ibuprofen (ADVIL,MOTRIN) 600 MG tablet Take 1 tablet (600 mg total) by mouth every 6 (six) hours as needed. 30 tablet 1   . metroNIDAZOLE (FLAGYL) 500 MG tablet Take 1 tablet (500 mg total) by mouth 2 (two) times daily for 14 days. 27 tablet 0   . naproxen (NAPROSYN) 375 MG tablet Take 1 tablet (375 mg total) by mouth 2 (two) times daily. 20 tablet 0     Review of Systems  Constitutional: Negative.   HENT: Negative.   Respiratory: Negative.   Cardiovascular: Negative.   Gastrointestinal: Positive for abdominal pain.   Physical Exam   Blood pressure  109/69, pulse 81, temperature 98.4 F (36.9 C), temperature source Oral, resp. rate 16, weight 62.1 kg, last menstrual period 06/02/2018, SpO2 100 %.  Physical Exam  Constitutional: She is oriented to person, place, and time. She appears well-developed.  HENT:  Head: Normocephalic.  Neck: Normal range of motion.  Musculoskeletal: Normal range of motion.  Neurological: She is alert and oriented to person, place, and time. She has normal reflexes.  Skin: Skin is warm and dry.  Psychiatric: She has a normal mood and affect.    MAU Course  Procedures  MDM -qualitative beta hcg is negative today in MAU.  -Reviewed that patient is not pregnant, and that naproxen is not indicated in pregnancy. She takes this infrequently.  -Reviewed that doxycycline is not recommended in pregnancy; patient should prevent against pregnancy while taking this medication.  -Reviewed how to take a pregnancy test; patient waited 10 minutes to read her result. I told her that that was  probably too long; she should follow the directions on the box exactly.  Assessment and Plan   1. Pregnancy examination or test, negative result    2. Patient stable for discharge with recommendation to re-start birth control if she does not want children. Continue to take Flagyl and doxy.   3. Patient to prevent pregnancy while taking doxycycline; patient verbalized understanding.   4. Reviewed how to take pregnancy test; reviewed warning signs and when to come to MAU vs. Urgent Care or ED.    Charlesetta GaribaldiKathryn Lorraine Kooistra 06/26/2018, 2:15 PM

## 2018-08-13 ENCOUNTER — Other Ambulatory Visit: Payer: Self-pay

## 2018-08-13 ENCOUNTER — Encounter (HOSPITAL_COMMUNITY): Payer: Self-pay | Admitting: Emergency Medicine

## 2018-08-13 ENCOUNTER — Emergency Department (HOSPITAL_COMMUNITY)
Admission: EM | Admit: 2018-08-13 | Discharge: 2018-08-13 | Disposition: A | Discharge status: Home / Self Care | Payer: Medicaid Other | Attending: Emergency Medicine | Admitting: Emergency Medicine

## 2018-08-13 DIAGNOSIS — R202 Paresthesia of skin: Secondary | ICD-10-CM | POA: Insufficient documentation

## 2018-08-13 NOTE — ED Provider Notes (Signed)
[facility] Emergency Department Provider Note MRN:  [id number]  Arrival date & time: 08/13/18     Chief Complaint   Paresthesias History of Present Illness   Karen Meyer is a 32 y.o. year-old female with a history of fibromyalgia presenting to the ED with chief complaint of paresthesias.  Patient explains that her symptoms began 9 years ago.  Diffuse intermittent pains throughout her body.  2 years ago, she began to experience generalized weakness, has issues doing any exercise due to weakness, bilateral arm and leg paresthesias.  Endorsing 2 years of intermittent lightheadedness, denies chest pain or shortness of breath, no abdominal pain, no dysuria.  Occasionally with blurred vision when she stands up.  No new or worsening symptoms for the past year.  Patient here to be referred.  Review of Systems  A complete 10 system review of systems was obtained and all systems are negative except as noted in the HPI and PMH.   Patient's Health History    Past Medical History:  Diagnosis Date  . Chronic pruritus   . Fibromyalgia   . Frequency of urination   . GERD (gastroesophageal reflux disease)   . Infertility, female   . Interstitial cystitis   . Migraine headache   . Mild acid reflux   . Nocturia   . PONV (postoperative nausea and vomiting)   . TMJ syndrome LEFT SIDE  . Urgency of urination     Past Surgical History:  Procedure Laterality Date  . CESAREAN SECTION  2007  . CYSTO WITH HYDRODISTENSION  08/16/2012   Procedure: CYSTOSCOPY/HYDRODISTENSION;  Surgeon: Bernestine Amass, MD;  Location: [facility];  Service: Urology;  Laterality: N/A;  . CYSTO/ HOD/ INSTILLATION CLORPACTIN  03-10-2011;  MAR 2011;  2009   FOR [facility].    Family History  Problem Relation Age of Onset  . Diabetes Mother   . Cancer Mother 44       2003; BREAST  . Diverticulosis Mother   . Hyperlipidemia Father     Social History   Socioeconomic History  . Marital  status: Divorced    Spouse name: Hassell Done  . Number of children: 1  . Years of education: 39  . Highest education level: Not on file  Occupational History  . Occupation: Theatre manager: [employer]    Comment: Psychologist, educational  Social Needs  . Financial resource strain: Not on file  . Food insecurity:    Worry: Not on file    Inability: Not on file  . Transportation needs:    Medical: Not on file    Non-medical: Not on file  Tobacco Use  . Smoking status: Never Smoker  . Smokeless tobacco: Never Used  Substance and Sexual Activity  . Alcohol use: No    Comment: RARE  . Drug use: No  . Sexual activity: Yes    Partners: Male    Birth control/protection: None  Lifestyle  . Physical activity:    Days per week: Not on file    Minutes per session: Not on file  . Stress: Not on file  Relationships  . Social connections:    Talks on phone: Not on file    Gets together: Not on file    Attends religious service: Not on file    Active member of club or organization: Not on file    Attends meetings of clubs or organizations: Not on file    Relationship status: Not on file  .  Intimate partner violence:    Fear of current or ex partner: Not on file    Emotionally abused: Not on file    Physically abused: Not on file    Forced sexual activity: Not on file  Other Topics Concern  . Not on file  Social History Narrative   Definitely does not want to become pregnant again.     Physical Exam  Vital Signs and Nursing Notes reviewed Vitals:   08/13/18 1423 08/13/18 1530  BP: 140/87 118/79  Pulse: 62 63  Resp: 14 14  Temp: 98.4 F (36.9 C)   SpO2: 100% 100%    CONSTITUTIONAL: Well-appearing, NAD NEURO:  Alert and oriented x 3, no focal deficits EYES:  eyes equal and reactive ENT/NECK:  no LAD, no JVD CARDIO: Regular rate, well-perfused, normal S1 and S2 PULM:  CTAB no wheezing or rhonchi GI/GU:  normal bowel sounds, non-distended, non-tender MSK/SPINE:  No gross  deformities, no edema SKIN:  no rash, atraumatic PSYCH:  Appropriate speech and behavior  Diagnostic and Interventional Summary    EKG Interpretation  Date/Time:    Ventricular Rate:    PR Interval:    QRS Duration:   QT Interval:    QTC Calculation:   R Axis:     Text Interpretation:        Labs Reviewed - No data to display  No orders to display    Medications - No data to display   Procedures Critical Care  ED Course and Medical Decision Making  I have reviewed the triage vital signs and the nursing notes.  Pertinent labs & imaging results that were available during my care of the patient were reviewed by me and considered in my medical decision making (see below for details).  Myriad of chronic symptoms in this 32 year old female with history of fibromyalgia, interstitial cystitis.  Question of underlying anxiety or depression as the driver of her symptoms.  Completely normal neurological exam, nothing to indicate emergent imaging today.  MS was considered, but again no symptoms to warrant emergent MRI imaging this evening.  No new symptoms to warrant EKG or laboratory testing.  Lost her PCP several months ago, here for referral.  Referral given as well as return precautions.  After the discussed management above, the patient was determined to be safe for discharge.  The patient was in agreement with this plan and all questions regarding their care were answered.  ED return precautions were discussed and the patient will return to the ED with any significant worsening of condition.  Barth Kirks. Sedonia Small, MD [place] [email or web address]  Final Clinical Impressions(s) / ED Diagnoses     ICD-10-CM   1. Paresthesias R20.2     ED Discharge Orders    None         Maudie Flakes, MD 08/13/18 1556

## 2018-08-13 NOTE — ED Triage Notes (Addendum)
Pt complaint of worsening numbness and weakness to all extremities; worsening over past year to point where unable to walk; pt steady gait without weakness noted in any extremity; continues to verbalizes overheating as well; pt shares that she has hx of fibromyoma but does not believe it is related as previously diagnosed by doctor.

## 2018-08-13 NOTE — Discharge Instructions (Addendum)
You were evaluated in the Emergency Department and after careful evaluation, we did not find any emergent condition requiring admission or further testing in the hospital.  Your symptoms today should definitely be followed up by primary care provider, who can determine the need for specialty referral.  Please return to the Emergency Department if you experience any worsening of your condition.  We encourage you to follow up with a primary care provider.  Thank you for allowing [place] to be a part of your care.

## 2018-08-14 ENCOUNTER — Ambulatory Visit: Payer: Medicaid Other | Admitting: Family Medicine

## 2018-12-24 ENCOUNTER — Other Ambulatory Visit: Payer: Self-pay

## 2018-12-24 ENCOUNTER — Emergency Department (HOSPITAL_COMMUNITY)
Admission: EM | Admit: 2018-12-24 | Discharge: 2018-12-24 | Disposition: A | Discharge status: Home / Self Care | Payer: Medicaid Other | Attending: Emergency Medicine | Admitting: Emergency Medicine

## 2018-12-24 ENCOUNTER — Encounter (HOSPITAL_COMMUNITY): Payer: Self-pay

## 2018-12-24 DIAGNOSIS — M25532 Pain in left wrist: Secondary | ICD-10-CM | POA: Diagnosis present

## 2018-12-24 DIAGNOSIS — M797 Fibromyalgia: Secondary | ICD-10-CM | POA: Insufficient documentation

## 2018-12-24 DIAGNOSIS — R531 Weakness: Secondary | ICD-10-CM | POA: Insufficient documentation

## 2018-12-24 MED ORDER — DICLOFENAC SODIUM 75 MG PO TBEC: 75.0000 mg | DELAYED_RELEASE_TABLET | Freq: Two times a day (BID) | ORAL | 0 refills | Status: DC

## 2018-12-24 NOTE — ED Triage Notes (Signed)
Pt reports L wrist pain from L hand up to L forearm. Hx fibromyalgia. No injury.

## 2018-12-24 NOTE — ED Provider Notes (Signed)
[facility]-EMERGENCY DEPT Provider Note   CSN: [id number] Arrival date & time: 12/24/18  1931     History   Chief Complaint Chief Complaint  Patient presents with  . Wrist Pain    L    HPI Karen Meyer is a 33 y.o. female.  The history is provided by the patient. No language interpreter was used.  Wrist Pain  This is a new problem. The problem occurs constantly. Nothing aggravates the symptoms. Nothing relieves the symptoms. She has tried nothing for the symptoms.  Pt complains of pain in her left wrist.  Pt reports she has weakness in both hands.  Pt reports she had to stop playing the piano 4 years ago because of pain in her hands and wrist.  Pt reports she has been seen at [facility] for the same.  Pt was told she had a vitamin D deficiency.  Pt reports she has had fibromyalgia her whole life.   Past Medical History:  Diagnosis Date  . Chronic pruritus   . Fibromyalgia   . Frequency of urination   . GERD (gastroesophageal reflux disease)   . Infertility, female   . Interstitial cystitis   . Migraine headache   . Mild acid reflux   . Nocturia   . PONV (postoperative nausea and vomiting)   . TMJ syndrome LEFT SIDE  . Urgency of urination     Patient Active Problem List   Diagnosis Date Noted  . Pregnancy examination or test, negative result 06/26/2018  . Pregnancy with uncertain fetal viability 11/29/2015  . LEUKOPENIA, CHRONIC 08/08/2010  . APHTHOUS STOMATITIS 08/08/2010  . MYALGIA 08/08/2010  . CHANGE IN BOWELS 08/08/2010  . 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 TMJ SYNDROME 07/31/2010  . GERD 07/31/2010  . UTI'S, HX OF 07/31/2010  . CYSTITIS, HX OF 07/31/2010  . THRUSH 07/30/2010    Past Surgical History:  Procedure Laterality Date  . CESAREAN SECTION  2007  . CYSTO WITH HYDRODISTENSION  08/16/2012   Procedure: CYSTOSCOPY/HYDRODISTENSION;  Surgeon: Valetta Fuller, MD;  Location: [facility];  Service: Urology;  Laterality:  N/A;  . CYSTO/ HOD/ INSTILLATION CLORPACTIN  03-10-2011;  MAR 2011;  2009   FOR [facility].     OB History    Gravida  3   Para  1   Term  1   Preterm  0   AB  1   Living  1     SAB  1   TAB  0   Ectopic  0   Multiple  0   Live Births               Home Medications    Prior to Admission medications   Medication Sig Start Date End Date Taking? Authorizing Provider  doxycycline (VIBRAMYCIN) 100 MG capsule Take 1 capsule (100 mg total) by mouth 2 (two) times daily. Patient not taking: Reported on 08/13/2018 06/23/18   Aviva Kluver B, PA-C  naproxen (NAPROSYN) 375 MG tablet Take 1 tablet (375 mg total) by mouth 2 (two) times daily. Patient not taking: Reported on 08/13/2018 06/23/18   Elisha Ponder, PA-C    Family History Family History  Problem Relation Age of Onset  . Diabetes Mother   . Cancer Mother 18       2003; BREAST  . Diverticulosis Mother   . Hyperlipidemia Father     Social History Social History   Tobacco Use  . Smoking status: Never Smoker  .  Smokeless tobacco: Never Used  Substance Use Topics  . Alcohol use: No    Comment: RARE  . Drug use: No     Allergies   Cymbalta [duloxetine hcl]; Propoxyphene; and Darvocet [propoxyphene n-acetaminophen]   Review of Systems Review of Systems  All other systems reviewed and are negative.    Physical Exam Updated Vital Signs BP 118/83 (BP Location: Right Arm)   Pulse 75   Temp 98.7 F (37.1 C) (Oral)   Resp 18   SpO2 100%   Physical Exam Vitals signs and nursing note reviewed.  Constitutional:      Appearance: Normal appearance.  Cardiovascular:     Rate and Rhythm: Normal rate.  Pulmonary:     Effort: Pulmonary effort is normal.  Musculoskeletal: Normal range of motion.        General: No swelling or tenderness.  Skin:    General: Skin is warm.  Neurological:     General: No focal deficit present.     Mental Status: She is alert and oriented to person, place, and time.    Psychiatric:        Mood and Affect: Mood normal.      ED Treatments / Results  Labs (all labs ordered are listed, but only abnormal results are displayed) Labs Reviewed - No data to display  EKG None  Radiology No results found.  Procedures Procedures (including critical care time)  Medications Ordered in ED Medications - No data to display   Initial Impression / Assessment and Plan / ED Course  I have reviewed the triage vital signs and the nursing notes.  Pertinent labs & imaging results that were available during my care of the patient were reviewed by me and considered in my medical decision making (see chart for details).     MDM  Pt had mri of brain, cspine, tspine and LS spine in !0/19.  Pt reports test were normal and that is when she was diagnosed with low vitamin D.  Pt has multiple symptoms.  She does not seem to have anything new except increased hand pain.   Pt does not currently have an MD.  I will try voltaren.  Pt advised to follow up with primary care.   Final Clinical Impressions(s) / ED Diagnoses   Final diagnoses:  Left wrist pain    ED Discharge Orders         Ordered    diclofenac (VOLTAREN) 75 MG EC tablet  2 times daily     12/24/18 2124        An After Visit Summary was printed and given to the patient.    Osie Cheeks 12/24/18 2124    Linwood Dibbles, MD 12/24/18 [phone]

## 2018-12-24 NOTE — Discharge Instructions (Signed)
Schedule follow up with primary care.  Return if any problems.

## 2019-01-09 ENCOUNTER — Encounter (HOSPITAL_BASED_OUTPATIENT_CLINIC_OR_DEPARTMENT_OTHER): Payer: Self-pay | Admitting: Emergency Medicine

## 2019-01-09 ENCOUNTER — Emergency Department (HOSPITAL_BASED_OUTPATIENT_CLINIC_OR_DEPARTMENT_OTHER)
Admission: EM | Admit: 2019-01-09 | Discharge: 2019-01-09 | Disposition: A | Discharge status: Home / Self Care | Payer: Medicaid Other | Attending: Emergency Medicine | Admitting: Emergency Medicine

## 2019-01-09 ENCOUNTER — Other Ambulatory Visit: Payer: Self-pay

## 2019-01-09 DIAGNOSIS — B9789 Other viral agents as the cause of diseases classified elsewhere: Secondary | ICD-10-CM

## 2019-01-09 DIAGNOSIS — Z79899 Other long term (current) drug therapy: Secondary | ICD-10-CM | POA: Insufficient documentation

## 2019-01-09 DIAGNOSIS — R05 Cough: Secondary | ICD-10-CM | POA: Diagnosis present

## 2019-01-09 DIAGNOSIS — J069 Acute upper respiratory infection, unspecified: Secondary | ICD-10-CM

## 2019-01-09 MED ORDER — ALBUTEROL SULFATE HFA 108 (90 BASE) MCG/ACT IN AERS
2.0000 | INHALATION_SPRAY | Freq: Once | RESPIRATORY_TRACT | Status: AC
  Administered 2019-01-09: 2 via RESPIRATORY_TRACT
  Filled 2019-01-09: qty 6.7

## 2019-01-09 MED ORDER — BENZONATATE 100 MG PO CAPS: 100.0000 mg | ORAL_CAPSULE | Freq: Three times a day (TID) | ORAL | 0 refills | Status: AC

## 2019-01-09 NOTE — ED Triage Notes (Signed)
Pt here with cough, fever, headache for 3 days.

## 2019-01-09 NOTE — Discharge Instructions (Signed)

## 2019-01-09 NOTE — ED Provider Notes (Signed)
[facility] EMERGENCY DEPARTMENT Provider Note   CSN: [id number] Arrival date & time: 01/09/19  1408    History   Chief Complaint Chief Complaint  Patient presents with  . Flu-like symptoms    HPI Karen Meyer is a 33 y.o. female.     HPI   Pt is a 33 y/o female with a h/o fibromyalgia, GERD, interstitial cystitis, migraines, TMS syndrome, who presents to the ED today c/o sneezing, productive cough, fatigue, rhinorrhea, congestion, mild sore throat, and fevers. She has some difficulty breathing. She also reports chest wall pain. She has tried nyquil with mild relief.    Past Medical History:  Diagnosis Date  . Chronic pruritus   . Fibromyalgia   . Frequency of urination   . GERD (gastroesophageal reflux disease)   . Infertility, female   . Interstitial cystitis   . Migraine headache   . Mild acid reflux   . Nocturia   . PONV (postoperative nausea and vomiting)   . TMJ syndrome LEFT SIDE  . Urgency of urination     Patient Active Problem List   Diagnosis Date Noted  . Pregnancy examination or test, negative result 06/26/2018  . Pregnancy with uncertain fetal viability 11/29/2015  . LEUKOPENIA, CHRONIC 08/08/2010  . APHTHOUS STOMATITIS 08/08/2010  . MYALGIA 08/08/2010  . CHANGE IN BOWELS 08/08/2010  . 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 TMJ SYNDROME 07/31/2010  . GERD 07/31/2010  . UTI'S, HX OF 07/31/2010  . CYSTITIS, HX OF 07/31/2010  . THRUSH 07/30/2010    Past Surgical History:  Procedure Laterality Date  . CESAREAN SECTION  2007  . CYSTO WITH HYDRODISTENSION  08/16/2012   Procedure: CYSTOSCOPY/HYDRODISTENSION;  Surgeon: Valetta Fulleravid S Grapey, MD;  [facility];  Service: Urology;  Laterality: N/A;  . CYSTO/ HOD/ INSTILLATION CLORPACTIN  03-10-2011;  MAR 2011;  2009   FOR [facility].     OB History    Gravida  3   Para  1   Term  1   Preterm  0   AB  1   Living  1     SAB  1   TAB  0   Ectopic  0   Multiple  0   Live Births               Home Medications    Prior to Admission medications   Medication Sig Start Date End Date Taking? Authorizing Provider  fluconazole (DIFLUCAN) 100 MG tablet Take 100 mg by mouth daily.   Yes [provider]  benzonatate (TESSALON) 100 MG capsule Take 1 capsule (100 mg total) by mouth every 8 (eight) hours for 5 days. 01/09/19 01/14/19  Lonnel Gjerde S, PA-C  diclofenac (VOLTAREN) 75 MG EC tablet Take 1 tablet (75 mg total) by mouth 2 (two) times daily. 12/24/18   Elson AreasSofia, Leslie K, PA-C  doxycycline (VIBRAMYCIN) 100 MG capsule Take 1 capsule (100 mg total) by mouth 2 (two) times daily. Patient not taking: Reported on 08/13/2018 06/23/18   Aviva KluverMurray, Alyssa B, PA-C  naproxen (NAPROSYN) 375 MG tablet Take 1 tablet (375 mg total) by mouth 2 (two) times daily. Patient not taking: Reported on 08/13/2018 06/23/18   Elisha PonderMurray, Alyssa B, PA-C    Family History Family History  Problem Relation Age of Onset  . Diabetes Mother   . Cancer Mother 8544       2003; BREAST  . Diverticulosis Mother   . Hyperlipidemia Father     Social  History Social History   Tobacco Use  . Smoking status: Never Smoker  . Smokeless tobacco: Never Used  Substance Use Topics  . Alcohol use: No    Comment: RARE  . Drug use: No     Allergies   Cymbalta [duloxetine hcl]; Propoxyphene; and Darvocet [propoxyphene n-acetaminophen]   Review of Systems Review of Systems  Constitutional: Positive for chills, fatigue and fever.  HENT: Positive for congestion, rhinorrhea and sore throat. Negative for ear pain.   Eyes: Negative for pain and visual disturbance.  Respiratory: Positive for cough and shortness of breath.   Cardiovascular: Negative for chest pain.  Gastrointestinal: Negative for abdominal pain, constipation, diarrhea, nausea and vomiting.  Genitourinary: Negative for flank pain.  Musculoskeletal: Positive for myalgias. Negative for back pain.  Skin: Negative for color change and  rash.  Neurological: Positive for headaches.  All other systems reviewed and are negative.    Physical Exam Updated Vital Signs BP 125/87 (BP Location: Right Arm)   Pulse 83   Temp 99.3 F (37.4 C) (Oral)   Resp (!) 24   LMP  (LMP Unknown)   SpO2 100%   Physical Exam Vitals signs and nursing note reviewed.  Constitutional:      General: She is not in acute distress.    Appearance: She is well-developed. She is not ill-appearing or toxic-appearing.  HENT:     Head: Normocephalic and atraumatic.     Right Ear: Tympanic membrane normal.     Left Ear: Tympanic membrane normal.     Nose: Congestion present.     Mouth/Throat:     Mouth: Mucous membranes are moist.     Pharynx: No oropharyngeal exudate or posterior oropharyngeal erythema.  Eyes:     Conjunctiva/sclera: Conjunctivae normal.  Neck:     Musculoskeletal: Neck supple.  Cardiovascular:     Rate and Rhythm: Normal rate and regular rhythm.     Heart sounds: Normal heart sounds. No murmur.  Pulmonary:     Effort: Pulmonary effort is normal. No respiratory distress.     Breath sounds: Normal breath sounds. No stridor. No wheezing, rhonchi or rales.     Comments: Dry cough on exam, no increased WOB. sattin at 100% on RA on monitor. Abdominal:     General: Bowel sounds are normal.     Palpations: Abdomen is soft.     Tenderness: There is no abdominal tenderness.  Lymphadenopathy:     Cervical: No cervical adenopathy.  Skin:    General: Skin is warm and dry.  Neurological:     Mental Status: She is alert.      ED Treatments / Results  Labs (all labs ordered are listed, but only abnormal results are displayed) Labs Reviewed - No data to display  EKG None  Radiology No results found.  Procedures Procedures (including critical care time)  Medications Ordered in ED Medications  albuterol (PROVENTIL HFA;VENTOLIN HFA) 108 (90 Base) MCG/ACT inhaler 2 puff (2 puffs Inhalation Given 01/09/19 1448)      Initial Impression / Assessment and Plan / ED Course  I have reviewed the triage vital signs and the nursing notes.  Pertinent labs & imaging results that were available during my care of the patient were reviewed by me and considered in my medical decision making (see chart for details).     Final Clinical Impressions(s) / ED Diagnoses   Final diagnoses:  Viral URI with cough   Patient presents with symptoms of viral URI.  She  does note some associated shortness of breath.  I offered to obtain chest x-ray to rule out bacterial pneumonia however patient declined stating that she thinks she has a viral infection as she thinks her symptoms feel similar to when she was diagnosed with bronchitis in the past. I gave albuterol inhaler in the ED and on reassessment patient states she still feels somewhat sob. I ambulated her and she maintained sats at 100% on RA up and down the hallway. She did not appear tachypneic. She again states she prefers to defer CXR. Pts sxs do seem viral in nature. She has no increased WOB, lungs are CTAB. I have low suspicion for PE or other acute pulmonary/cardiac pathology that would warrant further w/u or imaging. Pt is out of the window for tamiflu therefore flu testing was deferred. Pt will be discharged with symptomatic treatment.  Verbalizes understanding and is agreeable with plan. Pt is hemodynamically stable & in NAD prior to dc.   ED Discharge Orders         Ordered    benzonatate (TESSALON) 100 MG capsule  Every 8 hours     01/09/19 1501           Euna Armon S, PA-C 01/09/19 1501    Raeford Razor, MD 01/12/19 0710

## 2019-01-11 ENCOUNTER — Emergency Department (HOSPITAL_BASED_OUTPATIENT_CLINIC_OR_DEPARTMENT_OTHER)
Admission: EM | Admit: 2019-01-11 | Discharge: 2019-01-11 | Disposition: A | Discharge status: Home / Self Care | Payer: Medicaid Other | Attending: Emergency Medicine | Admitting: Emergency Medicine

## 2019-01-11 ENCOUNTER — Encounter (HOSPITAL_BASED_OUTPATIENT_CLINIC_OR_DEPARTMENT_OTHER): Payer: Self-pay | Admitting: Emergency Medicine

## 2019-01-11 ENCOUNTER — Other Ambulatory Visit: Payer: Self-pay

## 2019-01-11 ENCOUNTER — Emergency Department (HOSPITAL_BASED_OUTPATIENT_CLINIC_OR_DEPARTMENT_OTHER): Payer: Medicaid Other

## 2019-01-11 DIAGNOSIS — R05 Cough: Secondary | ICD-10-CM | POA: Diagnosis not present

## 2019-01-11 DIAGNOSIS — R059 Cough, unspecified: Secondary | ICD-10-CM

## 2019-01-11 DIAGNOSIS — Z0279 Encounter for issue of other medical certificate: Secondary | ICD-10-CM | POA: Insufficient documentation

## 2019-01-11 DIAGNOSIS — R509 Fever, unspecified: Secondary | ICD-10-CM | POA: Diagnosis present

## 2019-01-11 NOTE — Discharge Instructions (Signed)
You are seen in the ER today for continued fevers.  Your chest x-ray was normal.  We think this is likely a virus.  Please continue your at home care with Motrin/Tylenol per over-the-counter dosing.  Please continue use of Tessalon and albuterol inhaler for any cough or trouble breathing.  Please be sure to stay well-hydrated.  Please get plenty of rest.   Follow-up with primary care within 3 days.  Return to the ER for new or worsening symptoms or any other concerns.

## 2019-01-11 NOTE — ED Triage Notes (Addendum)
Continues to c/o fever.  Reports productive cough. States she has been treating fever with tylenol and motrin and this has been effective.  States that she called triage nurse who she stated told her to return to ER.  States "they won't let me go back to school".  Requesting school note.

## 2019-01-11 NOTE — ED Provider Notes (Signed)
[facility] EMERGENCY DEPARTMENT Provider Note   CSN: [id number] Arrival date & time: 01/11/19  1024    History   Chief Complaint Chief Complaint  Patient presents with  . Fever    HPI Karen Meyer is a 33 y.o. female with a hx of fibromyalgia, interstitial cystitis, & migraines who returns to the ER for continued fever & school note today. Patient seen in the ED and felt to have viral illness 2 days prior. States she was discharged w/ inhaler & tessalon which she has been utilizing as prescribed w/ improvement of her overall sxs. States she still has a productive cough, but this has gotten better. She notes continued fevers w/ temp max of 101.3 at home which improve with motrin/tylenol. She states she is here today because they will not let her come back to school with a fever and her note was only for 2 days. Other than what is mentioned above no specific alleviating/aggravating factors. She states she feels she does not need to be here. Denies dyspnea, wheezing, vomiting, abdominal pain, or diarrhea. No recent foreign travel.      HPI  Past Medical History:  Diagnosis Date  . Chronic pruritus   . Fibromyalgia   . Frequency of urination   . GERD (gastroesophageal reflux disease)   . Infertility, female   . Interstitial cystitis   . Migraine headache   . Mild acid reflux   . Nocturia   . PONV (postoperative nausea and vomiting)   . TMJ syndrome LEFT SIDE  . Urgency of urination     Patient Active Problem List   Diagnosis Date Noted  . Pregnancy examination or test, negative result 06/26/2018  . Pregnancy with uncertain fetal viability 11/29/2015  . LEUKOPENIA, CHRONIC 08/08/2010  . APHTHOUS STOMATITIS 08/08/2010  . MYALGIA 08/08/2010  . CHANGE IN BOWELS 08/08/2010  . 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 TMJ SYNDROME 07/31/2010  . GERD 07/31/2010  . UTI'S, HX OF 07/31/2010  . CYSTITIS, HX OF 07/31/2010  . THRUSH 07/30/2010    Past Surgical History:    Procedure Laterality Date  . CESAREAN SECTION  2007  . CYSTO WITH HYDRODISTENSION  08/16/2012   Procedure: CYSTOSCOPY/HYDRODISTENSION;  Surgeon: Valetta Fuller, MD;  Location: [facility];  Service: Urology;  Laterality: N/A;  . CYSTO/ HOD/ INSTILLATION CLORPACTIN  03-10-2011;  MAR 2011;  2009   FOR [facility].     OB History    Gravida  3   Para  1   Term  1   Preterm  0   AB  1   Living  1     SAB  1   TAB  0   Ectopic  0   Multiple  0   Live Births               Home Medications    Prior to Admission medications   Medication Sig Start Date End Date Taking? Authorizing Provider  benzonatate (TESSALON) 100 MG capsule Take 1 capsule (100 mg total) by mouth every 8 (eight) hours for 5 days. 01/09/19 01/14/19  Couture, Cortni S, PA-C  diclofenac (VOLTAREN) 75 MG EC tablet Take 1 tablet (75 mg total) by mouth 2 (two) times daily. 12/24/18   Elson Areas, PA-C  doxycycline (VIBRAMYCIN) 100 MG capsule Take 1 capsule (100 mg total) by mouth 2 (two) times daily. Patient not taking: Reported on 08/13/2018 06/23/18   Aviva Kluver B, PA-C  fluconazole (DIFLUCAN) 100 MG  tablet Take 100 mg by mouth daily.    [provider]  naproxen (NAPROSYN) 375 MG tablet Take 1 tablet (375 mg total) by mouth 2 (two) times daily. Patient not taking: Reported on 08/13/2018 06/23/18   Elisha Ponder, PA-C    Family History Family History  Problem Relation Age of Onset  . Diabetes Mother   . Cancer Mother 74       2003; BREAST  . Diverticulosis Mother   . Hyperlipidemia Father     Social History Social History   Tobacco Use  . Smoking status: Never Smoker  . Smokeless tobacco: Never Used  Substance Use Topics  . Alcohol use: No    Comment: RARE  . Drug use: No     Allergies   Cymbalta [duloxetine hcl]; Propoxyphene; and Darvocet [propoxyphene n-acetaminophen]   Review of Systems Review of Systems  Constitutional: Positive for fever.  HENT: Positive  for congestion. Negative for ear pain and sore throat.   Respiratory: Positive for cough. Negative for shortness of breath and wheezing.   Cardiovascular: Negative for chest pain.  Gastrointestinal: Negative for abdominal pain, diarrhea and vomiting.  Neurological: Negative for syncope.    Physical Exam Updated Vital Signs BP 122/76 (BP Location: Right Arm)   Pulse 78   Temp 98.2 F (36.8 C) (Oral)   Resp 16   Ht  (1.626 m)   Wt 59.9 kg   LMP  (LMP Unknown)   SpO2 100%   BMI 22.66 kg/m   Physical Exam Vitals signs and nursing note reviewed.  Constitutional:      General: She is not in acute distress.    Appearance: She is well-developed.  HENT:     Head: Normocephalic and atraumatic.     Right Ear: Tympanic membrane, ear canal and external ear normal. Tympanic membrane is not perforated, erythematous, retracted or bulging.     Left Ear: Tympanic membrane, ear canal and external ear normal. Tympanic membrane is not perforated, erythematous, retracted or bulging.     Nose: Congestion present.     Comments: No sinus tenderness.     Mouth/Throat:     Pharynx: Uvula midline. No oropharyngeal exudate or posterior oropharyngeal erythema.  Eyes:     General:        Right eye: No discharge.        Left eye: No discharge.     Conjunctiva/sclera: Conjunctivae normal.     Pupils: Pupils are equal, round, and reactive to light.  Neck:     Musculoskeletal: Normal range of motion and neck supple.  Cardiovascular:     Rate and Rhythm: Normal rate and regular rhythm.     Heart sounds: No murmur.  Pulmonary:     Effort: Pulmonary effort is normal. No respiratory distress.     Breath sounds: Normal breath sounds. No wheezing or rales.  Abdominal:     General: There is no distension.     Palpations: Abdomen is soft.     Tenderness: There is no abdominal tenderness.  Lymphadenopathy:     Cervical: No cervical adenopathy.  Skin:    General: Skin is warm and dry.     Findings:  No rash.  Neurological:     Mental Status: She is alert.  Psychiatric:        Behavior: Behavior normal.    ED Treatments / Results  Labs (all labs ordered are listed, but only abnormal results are displayed) Labs Reviewed - No data to display  EKG None  Radiology Dg Chest 2 View  Result Date: 01/11/2019 CLINICAL DATA:  Cough and fever. EXAM: CHEST - 2 VIEW COMPARISON:  06/04/2008 and 03/16/2014 FINDINGS: The heart size and mediastinal contours are within normal limits. Both lungs are clear. The visualized skeletal structures are unremarkable. IMPRESSION: Normal exam. Electronically Signed   By: Francene Boyers M.D.   On: 01/11/2019 12:23    Procedures Procedures (including critical care time)  Medications Ordered in ED Medications - No data to display   Initial Impression / Assessment and Plan / ED Course  I have reviewed the triage vital signs and the nursing notes.  Pertinent labs & imaging results that were available during my care of the patient were reviewed by me and considered in my medical decision making (see chart for details).   Patient presents to the emergency department for school note secondary to persistent fever.  She was seen in the emergency department 2 days prior for presumed viral illness and discharged with albuterol inhaler as well as Tessalon.  She has been taking these medicines as well as Motrin/Tylenol with improvement..  She remains with some persistent productive cough and fevers (fevers do improve with Motrin/Tylenol).  She states that her school will not allow her to be present with a fever and she is here for a school note, she otherwise feels she does not need to be here.  With persistent fever and cough chest x-ray was obtained and negative for infiltrate consistent with pneumonia.  Remain with suspicion for viral illness.  Outside of Tamiflu window.  Recommended continued use of medicines at home.  School note was provided.  PCP follow-up. I  discussed results, treatment plan, need for follow-up, and return precautions with the patient. Provided opportunity for questions, patient confirmed understanding and is in agreement with plan.    Final Clinical Impressions(s) / ED Diagnoses   Final diagnoses:  Cough    ED Discharge Orders    None       Cherly Anderson, PA-C 01/11/19 1239    Tegeler, Canary Brim, MD 01/11/19 [phone]

## 2019-04-19 ENCOUNTER — Encounter: Payer: Self-pay | Admitting: *Deleted

## 2020-01-02 DIAGNOSIS — G9332 Myalgic encephalomyelitis/chronic fatigue syndrome: Secondary | ICD-10-CM | POA: Insufficient documentation

## 2022-01-23 ENCOUNTER — Other Ambulatory Visit: Payer: Self-pay

## 2022-01-23 ENCOUNTER — Emergency Department (HOSPITAL_BASED_OUTPATIENT_CLINIC_OR_DEPARTMENT_OTHER)
Admission: EM | Admit: 2022-01-23 | Discharge: 2022-01-23 | Disposition: A | Discharge status: Home / Self Care | Payer: Medicaid Other | Attending: Emergency Medicine | Admitting: Emergency Medicine

## 2022-01-23 ENCOUNTER — Encounter (HOSPITAL_BASED_OUTPATIENT_CLINIC_OR_DEPARTMENT_OTHER): Payer: Self-pay | Admitting: Emergency Medicine

## 2022-01-23 DIAGNOSIS — N390 Urinary tract infection, site not specified: Secondary | ICD-10-CM | POA: Insufficient documentation

## 2022-01-23 DIAGNOSIS — R110 Nausea: Secondary | ICD-10-CM | POA: Diagnosis present

## 2022-01-23 LAB — URINALYSIS, ROUTINE W REFLEX MICROSCOPIC
Bilirubin Urine: NEGATIVE
Glucose, UA: NEGATIVE mg/dL
Hgb urine dipstick: NEGATIVE
Ketones, ur: NEGATIVE mg/dL
Nitrite: NEGATIVE
Protein, ur: NEGATIVE mg/dL
Specific Gravity, Urine: >=1.03 (ref 1.005–1.030)
pH: 5.5 (ref 5.0–8.0)

## 2022-01-23 LAB — URINALYSIS, MICROSCOPIC (REFLEX)

## 2022-01-23 LAB — HCG, QUANTITATIVE, PREGNANCY: hCG, Beta Chain, Quant, S: <1 m[IU]/mL (ref <5)

## 2022-01-23 LAB — PREGNANCY, URINE: Preg Test, Ur: NEGATIVE

## 2022-01-23 MED ORDER — NITROFURANTOIN MONOHYD MACRO 100 MG PO CAPS
100.0000 mg | ORAL_CAPSULE | Freq: Once | ORAL | Status: AC
  Administered 2022-01-23: 100 mg via ORAL
  Filled 2022-01-23: qty 1

## 2022-01-23 MED ORDER — ONDANSETRON 4 MG PO TBDP
8.0000 mg | ORAL_TABLET | Freq: Once | ORAL | Status: AC
  Administered 2022-01-23: 8 mg via ORAL
  Filled 2022-01-23: qty 2

## 2022-01-23 MED ORDER — NITROFURANTOIN MONOHYD MACRO 100 MG PO CAPS: 100.0000 mg | ORAL_CAPSULE | Freq: Two times a day (BID) | ORAL | 0 refills | Status: DC

## 2022-01-23 NOTE — ED Triage Notes (Signed)
Pt states she has been very nauseated for the past few days  Pt states she took a pregnancy test and it was positive   Pt is c/o low back pain and also c/o some pelvic pain    ?

## 2022-01-23 NOTE — ED Notes (Signed)
ED Provider at bedside. 

## 2022-01-23 NOTE — ED Provider Notes (Signed)
?[facility] EMERGENCY DEPARTMENT ?Provider Note ? ? ?CSN: [id number] ?Arrival date & time: 01/23/22  0119 ? ?  ? ?History ? ?Chief Complaint  ?Patient presents with  ? Nausea  ? ? ?Karen BunnellMargiana F Meyer is a 36 y.o. female. ? ?The history is provided by the patient.  ?Illness ?Location:  Body ?Quality:  Patient with nausea x 3 days and taking PO liquids,  also reports positive home pregancy test.  LMP 2/14.23. Also dysuria ?Severity:  Mild ?Onset quality:  Gradual ?Duration:  3 days ?Timing:  Constant ?Progression:  Unchanged ?Chronicity:  New ?Context:  LMP 2/14 on time ?Relieved by:  Nothing ?Worsened by:  Nothing ?Ineffective treatments:  Non ?Associated symptoms: nausea   ?Associated symptoms: no abdominal pain, no chest pain, no congestion, no cough, no diarrhea, no shortness of breath, no sore throat and no vomiting   ? ?  ? ?Home Medications ?Prior to Admission medications   ?Medication Sig Start Date End Date Taking? Authorizing Provider  ?diclofenac (VOLTAREN) 75 MG EC tablet Take 1 tablet (75 mg total) by mouth 2 (two) times daily. 12/24/18   Elson AreasSofia, Leslie K, PA-C  ?doxycycline (VIBRAMYCIN) 100 MG capsule Take 1 capsule (100 mg total) by mouth 2 (two) times daily. ?Patient not taking: Reported on 08/13/2018 06/23/18   Aviva KluverMurray, Alyssa B, PA-C  ?fluconazole (DIFLUCAN) 100 MG tablet Take 100 mg by mouth daily.    [provider]  ?naproxen (NAPROSYN) 375 MG tablet Take 1 tablet (375 mg total) by mouth 2 (two) times daily. ?Patient not taking: Reported on 08/13/2018 06/23/18   Elisha PonderMurray, Alyssa B, PA-C  ?nitrofurantoin, macrocrystal-monohydrate, (MACROBID) 100 MG capsule Take 1 capsule (100 mg total) by mouth 2 (two) times daily. X 7 days 01/23/22   Cy BlamerPalumbo, Chivonne Rascon, MD  ?   ? ?Allergies    ?Cymbalta [duloxetine hcl], Propoxyphene, Darvocet [propoxyphene n-acetaminophen], and Niacin and related   ? ?Review of Systems   ?Review of Systems  ?HENT:  Negative for congestion and sore throat.   ?Eyes:  Negative  for redness.  ?Respiratory:  Negative for cough and shortness of breath.   ?Cardiovascular:  Negative for chest pain.  ?Gastrointestinal:  Positive for nausea. Negative for abdominal pain, diarrhea and vomiting.  ?Genitourinary:  Positive for dysuria. Negative for difficulty urinating, vaginal bleeding and vaginal discharge.  ?Musculoskeletal:  Negative for back pain.  ?Psychiatric/Behavioral:  Negative for agitation.   ?All other systems reviewed and are negative. ? ?Physical Exam ?Updated Vital Signs ?BP (!) 121/93 (BP Location: Left Arm)   Pulse 72   Temp 97.9 ?F (36.6 ?C) (Oral)   Resp 18   Ht 5\' 4"  (1.626 m)   Wt 63.5 kg   LMP 12/24/2021   SpO2 100%   BMI 24.03 kg/m?  ?Physical Exam ?Vitals and nursing note reviewed.  ?Constitutional:   ?   Appearance: Normal appearance.  ?HENT:  ?   Head: Normocephalic and atraumatic.  ?   Nose: Nose normal.  ?Eyes:  ?   Conjunctiva/sclera: Conjunctivae normal.  ?   Pupils: Pupils are equal, round, and reactive to light.  ?Cardiovascular:  ?   Rate and Rhythm: Normal rate and regular rhythm.  ?   Pulses: Normal pulses.  ?   Heart sounds: Normal heart sounds.  ?Pulmonary:  ?   Effort: Pulmonary effort is normal.  ?   Breath sounds: Normal breath sounds.  ?Abdominal:  ?   General: Abdomen is flat. Bowel sounds are normal.  ?  Palpations: Abdomen is soft.  ?   Tenderness: There is no abdominal tenderness. There is no guarding.  ?Musculoskeletal:     ?   General: Normal range of motion.  ?   Cervical back: Normal range of motion and neck supple.  ?Skin: ?   General: Skin is warm and dry.  ?   Capillary Refill: Capillary refill takes less than 2 seconds.  ?Neurological:  ?   General: No focal deficit present.  ?   Mental Status: She is alert and oriented to person, place, and time.  ?   Deep Tendon Reflexes: Reflexes normal.  ?Psychiatric:     ?   Mood and Affect: Mood normal.     ?   Behavior: Behavior normal.  ? ? ?ED Results / Procedures / Treatments   ?Labs ?(all labs  ordered are listed, but only abnormal results are displayed) ?Results for orders placed or performed during the hospital encounter of 01/23/22  ?Pregnancy, urine  ?Result Value Ref Range  ? Preg Test, Ur NEGATIVE NEGATIVE  ?Urinalysis, Routine w reflex microscopic Urine, Clean Catch  ?Result Value Ref Range  ? Color, Urine YELLOW YELLOW  ? APPearance HAZY (A) CLEAR  ? Specific Gravity, Urine >=1.030 1.005 - 1.030  ? pH 5.5 5.0 - 8.0  ? Glucose, UA NEGATIVE NEGATIVE mg/dL  ? Hgb urine dipstick NEGATIVE NEGATIVE  ? Bilirubin Urine NEGATIVE NEGATIVE  ? Ketones, ur NEGATIVE NEGATIVE mg/dL  ? Protein, ur NEGATIVE NEGATIVE mg/dL  ? Nitrite NEGATIVE NEGATIVE  ? Leukocytes,Ua TRACE (A) NEGATIVE  ?Urinalysis, Microscopic (reflex)  ?Result Value Ref Range  ? RBC / HPF 0-5 0 - 5 RBC/hpf  ? WBC, UA 6-10 0 - 5 WBC/hpf  ? Bacteria, UA MANY (A) NONE SEEN  ? Squamous Epithelial / LPF 6-10 0 - 5  ? ?No results found. ? ?Radiology ?No results found. ? ?Procedures ?Procedures  ? ? ?Medications Ordered in ED ?Medications  ?ondansetron (ZOFRAN-ODT) disintegrating tablet 8 mg (8 mg Oral Given 01/23/22 0236)  ?nitrofurantoin (macrocrystal-monohydrate) (MACROBID) capsule 100 mg (100 mg Oral Given 01/23/22 0235)  ? ? ?ED Course/ Medical Decision Making/ A&P ?  ?                        ?Medical Decision Making ?Uti symptoms and nausea, no emesis.  Also reportedly positive home pregnancy test  ? ?Problems Addressed: ?Urinary tract infection without hematuria, site unspecified: acute illness or injury ?   Details: treated with macrobid ? ?Amount and/or Complexity of Data Reviewed ?Independent Historian: spouse ?   Details: see above ?Labs: ordered. ?   Details: urinalysis and pregnancy test reviewed by me, pregnancy test is negative and urine is positive for UTI.  confirmed negative pregnancy test with quant and this is also negative ? ?Risk ?Prescription drug management. ?Risk Details: Treated for UTI and nausea in the ED, patient is not  pregnant at this time.  I am not sure if patient read test wrong or it was a manufacture defect.  Will treat for UTI as an outpatient  ? ? ?Final Clinical Impression(s) / ED Diagnoses ?Final diagnoses:  ?Nausea  ?Urinary tract infection without hematuria, site unspecified  ? ?Return for intractable cough, coughing up blood, fevers > 100.4 unrelieved by medication, shortness of breath, intractable vomiting, chest pain, shortness of breath, weakness, numbness, changes in speech, facial asymmetry, abdominal pain, passing out, Inability to tolerate liquids or food, cough, altered mental status or any  concerns. No signs of systemic illness or infection. The patient is nontoxic-appearing on exam and vital signs are within normal limits.  ?I have reviewed the triage vital signs and the nursing notes. Pertinent labs & imaging results that were available during my care of the patient were reviewed by me and considered in my medical decision making (see chart for details). After history, exam, and medical workup I feel the patient has been appropriately medically screened and is safe for discharge home. Pertinent diagnoses were discussed with the patient. Patient was given return precautions.  ?Rx / DC Orders ?ED Discharge Orders   ? ?      Ordered  ?  nitrofurantoin, macrocrystal-monohydrate, (MACROBID) 100 MG capsule  2 times daily,   Status:  Discontinued       ? 01/23/22 0230  ?  nitrofurantoin, macrocrystal-monohydrate, (MACROBID) 100 MG capsule  2 times daily       ? 01/23/22 0233  ? ?  ?  ? ?  ? ? ?  ?Alnisa Hasley, MD ?01/23/22 0353 ? ?

## 2023-04-24 ENCOUNTER — Ambulatory Visit (INDEPENDENT_AMBULATORY_CARE_PROVIDER_SITE_OTHER): Payer: Medicaid Other | Admitting: Podiatry

## 2023-04-24 ENCOUNTER — Ambulatory Visit (INDEPENDENT_AMBULATORY_CARE_PROVIDER_SITE_OTHER): Payer: Medicaid Other

## 2023-04-24 ENCOUNTER — Telehealth (HOSPITAL_COMMUNITY): Payer: Self-pay | Admitting: Physician Assistant

## 2023-04-24 ENCOUNTER — Ambulatory Visit (HOSPITAL_COMMUNITY)
Admission: EM | Admit: 2023-04-24 | Discharge: 2023-04-24 | Disposition: A | Discharge status: Home / Self Care | Payer: Medicaid Other | Attending: Physician Assistant | Admitting: Physician Assistant

## 2023-04-24 ENCOUNTER — Encounter (HOSPITAL_COMMUNITY): Payer: Self-pay | Admitting: Emergency Medicine

## 2023-04-24 DIAGNOSIS — M778 Other enthesopathies, not elsewhere classified: Secondary | ICD-10-CM

## 2023-04-24 DIAGNOSIS — L209 Atopic dermatitis, unspecified: Secondary | ICD-10-CM | POA: Diagnosis not present

## 2023-04-24 DIAGNOSIS — M722 Plantar fascial fibromatosis: Secondary | ICD-10-CM

## 2023-04-24 MED ORDER — TRIAMCINOLONE ACETONIDE 10 MG/ML IJ SUSP
10.0000 mg | Freq: Once | INTRAMUSCULAR | Status: AC
  Administered 2023-04-24: 10 mg

## 2023-04-24 MED ORDER — TRIAMCINOLONE ACETONIDE 0.1 % EX OINT: TOPICAL_OINTMENT | Freq: Two times a day (BID) | CUTANEOUS | 0 refills | Status: DC | PRN

## 2023-04-24 MED ORDER — CETIRIZINE HCL 10 MG PO TABS: 10.0000 mg | ORAL_TABLET | Freq: Every day | ORAL | 0 refills | Status: DC

## 2023-04-24 MED ORDER — TRIAMCINOLONE ACETONIDE 0.1 % EX OINT: TOPICAL_OINTMENT | Freq: Two times a day (BID) | CUTANEOUS | 0 refills | Status: DC

## 2023-04-24 NOTE — Progress Notes (Signed)
Chief Complaint  Patient presents with   Foot Pain    Bilateral heel pain. Sharp pain that begins on the heel and radiates to arch. Swelling to bilateral feet, feels like walking on rocks. Pain started on Friday. Right foot is worst than the left.     HPI: 37 y.o. female presenting today with c/o pain in the bottom of both heels, denies trauma.  States that there is pain with weightbearing.  She does have a history of pertinent for fibromyalgia.  Wearing flip-flops today.  Past Medical History:  Diagnosis Date   Chronic pruritus    Fibromyalgia    Frequency of urination    GERD (gastroesophageal reflux disease)    Infertility, female    Interstitial cystitis    Migraine headache    Mild acid reflux    Nocturia    PONV (postoperative nausea and vomiting)    TMJ syndrome LEFT SIDE   Urgency of urination     Past Surgical History:  Procedure Laterality Date   CESAREAN SECTION  2007   CYSTO WITH HYDRODISTENSION  08/16/2012   Procedure: CYSTOSCOPY/HYDRODISTENSION;  Surgeon: Valetta Fuller, MD;  Location: [facility];  Service: Urology;  Laterality: N/A;   CYSTO/ HOD/ INSTILLATION CLORPACTIN  03-10-2011;  MAR 2011;  2009   FOR [facility]    Allergies  Allergen Reactions   Cymbalta [Duloxetine Hcl] Shortness Of Breath    Difficulty Breathing    Propoxyphene Hives   Darvocet [Propoxyphene N-Acetaminophen] Hives   Niacin And Related Hives     Physical Exam: General: The patient is alert and oriented x3 in no acute distress.  Dermatology:  No ecchymosis, erythema, or edema bilateral.  No open lesions.    Vascular: Palpable pedal pulses bilaterally. Capillary refill within normal limits.  No appreciable edema.    Neurological: Light touch sensation intact bilateral.  MMT 5/5 to lower extremity bilateral. Negative Tinel's sign with percussion of the posterior tibial nerve on the affected extremity.    Musculoskeletal Exam:  There is pain on palpation of the  plantarmedial & plantarcentral aspect of both heels.  No gaps or nodules within the plantar fascia.  Positive Windlass mechanism bilateral.  Antalgic gait noted with first few steps upon standing.  No pain on palpation of achilles tendon bilateral.  Ankle df less than 10 degrees with knee extended b/l.  Radiographic Exam (bilateral foot, 3 weightbearing views, 04/24/2023):  Normal osseous mineralization. Joint spaces preserved.  There is a small heel spur on the inferior aspect of the left calcaneus.  No heel spur on the right.  No other osseous abnormalities.  Assessment/Plan of Care: 1. Capsulitis of foot   2. Plantar fasciitis, bilateral     Meds ordered this encounter  Medications   triamcinolone acetonide (KENALOG) 10 MG/ML injection 10 mg   -Reviewed etiology of plantar fasciitis with patient.  Discussed treatment options with patient today, including cortisone injection, NSAID course of treatment, stretching exercises, physical therapy, use of night splint, rest, icing the heel, arch supports/orthotics, and supportive shoe gear.    With the patient's consent bilateral corticosteroid injections were administered to the plantar aspect of both heels utilizing a medial plate.  After sterile skin prep combination of 1% Xylocaine plain, 0.5 Sensorcaine plain, and Kenalog 10 were administered for a total of 1.25 cc to each foot.  She tolerated this well.  Band-Aids were applied.  Discussed stretching exercises with the patient.  Reviewed supportive shoe gear  with arch support or a slightly elevated heel to provide relief until her symptoms resolved.  Follow-up in approximately 4 weeks for recheck   Return in about 4 weeks (around 05/22/2023) for B/L plantar fasciitis recheck.   Clerance Lav, DPM, FACFAS [facility]     [address] [place], [place] [address]                Office [phone]  Fax [phone]

## 2023-04-24 NOTE — ED Triage Notes (Signed)
Pt c/o rash on bilateral legs and arms for a couple months. Pt requesting referral to dermatology and possible a cream.

## 2023-04-24 NOTE — Telephone Encounter (Signed)
Received a fax from [facility] that triamcinolone/Eucerin ointment prescribed at visit needed a more specific sig and there were questions regarding whether it was a 1:1 compound.  New prescription with this specific information was sent to pharmacy electronically.

## 2023-04-24 NOTE — ED Provider Notes (Signed)
[facility]    CSN: [id number] Arrival date & time: 04/24/23  4098      History   Chief Complaint Chief Complaint  Patient presents with   Rash    HPI Karen Meyer is a 37 y.o. female.   Patient presents today with a several month history of pruritic rash particularly on her lower extremities.  She has tried cocoa butter and skin oils without improvement of symptoms.  Denies any changes to personal hygiene products including soaps or detergents.  Reports that she does have a very sensitive system and often has widespread reactions to small stimuli.  She does report that approximately a month before her symptoms began she was bitten by ants on her ankles but this is since resolved.  Denies any additional insect, plant, animal exposure.  She denies history of dermatological condition including eczema or psoriasis.  She has not seen a dermatologist in the past.  She reports that the pruritus is significant and keeping her up at night.  Denies any associated fever, nausea, vomiting.    Past Medical History:  Diagnosis Date   Chronic pruritus    Fibromyalgia    Frequency of urination    GERD (gastroesophageal reflux disease)    Infertility, female    Interstitial cystitis    Migraine headache    Mild acid reflux    Nocturia    PONV (postoperative nausea and vomiting)    TMJ syndrome LEFT SIDE   Urgency of urination     Patient Active Problem List   Diagnosis Date Noted   Chronic fatigue syndrome 01/02/2020   Pregnancy examination or test, negative result 06/26/2018   Pregnancy with uncertain fetal viability 11/29/2015   Fibromyalgia 08/14/2011   Irritable bowel syndrome 08/14/2011   LEUKOPENIA, CHRONIC 08/08/2010   APHTHOUS STOMATITIS 08/08/2010   MYALGIA 08/08/2010   CHANGE IN BOWELS 08/08/2010   PARVOVIRUS B19 07/31/2010   TMJ SYNDROME 07/31/2010   GERD 07/31/2010   UTI'S, HX OF 07/31/2010   CYSTITIS, HX OF 07/31/2010   THRUSH 07/30/2010     Past Surgical History:  Procedure Laterality Date   CESAREAN SECTION  2007   CYSTO WITH HYDRODISTENSION  08/16/2012   Procedure: CYSTOSCOPY/HYDRODISTENSION;  Surgeon: Valetta Fuller, MD;  Location: [facility];  Service: Urology;  Laterality: N/A;   CYSTO/ HOD/ INSTILLATION CLORPACTIN  03-10-2011;  MAR 2011;  2009   FOR [facility]    OB History     Gravida  4   Para  1   Term  1   Preterm  0   AB  1   Living  1      SAB  1   IAB  0   Ectopic  0   Multiple  0   Live Births               Home Medications    Prior to Admission medications   Medication Sig Start Date End Date Taking? Authorizing Provider  cetirizine (ZYRTEC ALLERGY) 10 MG tablet Take 1 tablet (10 mg total) by mouth at bedtime. 04/24/23  Yes Renna Kilmer K, PA-C  triamcinolone 0.1% oint-Eucerin equivalent cream 1:1 mixture Apply topically 2 (two) times daily as needed. 04/24/23  Yes Fransisco Messmer, Noberto Retort, PA-C    Family History Family History  Problem Relation Age of Onset   Diabetes Mother    Cancer Mother 40       2003; BREAST   Diverticulosis Mother    Hyperlipidemia  Father     Social History Social History   Tobacco Use   Smoking status: Never   Smokeless tobacco: Never  Vaping Use   Vaping Use: Never used  Substance Use Topics   Alcohol use: Yes    Comment: RARE   Drug use: No     Allergies   Cymbalta [duloxetine hcl], Propoxyphene, Darvocet [propoxyphene n-acetaminophen], and Niacin and related   Review of Systems Review of Systems  Constitutional:  Negative for activity change, appetite change, fatigue and fever.  Gastrointestinal:  Negative for abdominal pain, diarrhea, nausea and vomiting.  Skin:  Positive for rash. Negative for color change and wound.  Neurological:  Negative for weakness and numbness.     Physical Exam Triage Vital Signs ED Triage Vitals [04/24/23 1049]  Enc Vitals Group     BP 111/68     Pulse Rate 60     Resp 14     Temp 98 F  (36.7 C)     Temp Source Oral     SpO2 98 %     Weight      Height      Head Circumference      Peak Flow      Pain Score 0     Pain Loc      Pain Edu?      Excl. in GC?    No data found.  Updated Vital Signs BP 111/68 (BP Location: Right Arm)   Pulse 60   Temp 98 F (36.7 C) (Oral)   Resp 14   LMP 04/19/2023 (Exact Date)   SpO2 98%   Visual Acuity Right Eye Distance:   Left Eye Distance:   Bilateral Distance:    Right Eye Near:   Left Eye Near:    Bilateral Near:     Physical Exam Vitals reviewed.  Constitutional:      General: She is awake. She is not in acute distress.    Appearance: Normal appearance. She is well-developed. She is not ill-appearing.     Comments: Very pleasant female appears stated age in no acute distress sitting comfortably in exam room  HENT:     Head: Normocephalic and atraumatic.  Cardiovascular:     Rate and Rhythm: Normal rate and regular rhythm.     Heart sounds: Normal heart sounds, S1 normal and S2 normal. No murmur heard. Pulmonary:     Effort: Pulmonary effort is normal.     Breath sounds: Normal breath sounds. No wheezing, rhonchi or rales.     Comments: Clear to auscultation bilaterally Skin:    Findings: Rash present. Rash is macular and papular.          Comments: Multiple discrete annular areas of maculopapular rash with evidence of excoriation noted on bilateral lower extremities.  No bleeding or drainage noted.  No streaking or evidence of lymphangitis.  Psychiatric:        Behavior: Behavior is cooperative.      UC Treatments / Results  Labs (all labs ordered are listed, but only abnormal results are displayed) Labs Reviewed - No data to display  EKG   Radiology No results found.  Procedures Procedures (including critical care time)  Medications Ordered in UC Medications - No data to display  Initial Impression / Assessment and Plan / UC Course  I have reviewed the triage vital signs and the nursing  notes.  Pertinent labs & imaging results that were available during my care of the patient were reviewed  by me and considered in my medical decision making (see chart for details).     Patient is well-appearing, afebrile, nontoxic, nontachycardic.  Concern for atopic dermatitis.  She was started on Eucerin/triamcinolone combination twice daily.  Will also add cetirizine to help with pruritus.  Recommend that she follow-up with dermatology was given contact information for local provider.  She is to use hypoallergenic soaps and detergents.  Discussed that if she has any worsening or changing symptoms she needs to be seen immediately.  Strict return precautions given.  All questions answered to patient satisfaction.  Final Clinical Impressions(s) / UC Diagnoses   Final diagnoses:  Atopic dermatitis, unspecified type     Discharge Instructions      I believe your symptoms are related to eczema/atopic dermatitis.  Use hypoallergenic soaps and detergents.  Apply Eucerin/triamcinolone twice daily to affected areas.  Take cetirizine at night to help with the itching.  Follow-up with dermatology; call to schedule an appointment.  If anything worsens you have widespread rash, fever, nausea, vomiting you need to be seen immediately.     ED Prescriptions     Medication Sig Dispense Auth. Provider   triamcinolone 0.1% oint-Eucerin equivalent cream 1:1 mixture Apply topically 2 (two) times daily as needed. 120 g Amisha Pospisil K, PA-C   cetirizine (ZYRTEC ALLERGY) 10 MG tablet Take 1 tablet (10 mg total) by mouth at bedtime. 30 tablet Miliani Deike, Noberto Retort, PA-C      PDMP not reviewed this encounter.   Jeani Hawking, PA-C 04/24/23 1109

## 2023-04-24 NOTE — Discharge Instructions (Addendum)
I believe your symptoms are related to eczema/atopic dermatitis.  Use hypoallergenic soaps and detergents.  Apply Eucerin/triamcinolone twice daily to affected areas.  Take cetirizine at night to help with the itching.  Follow-up with dermatology; call to schedule an appointment.  If anything worsens you have widespread rash, fever, nausea, vomiting you need to be seen immediately.

## 2023-05-19 ENCOUNTER — Ambulatory Visit: Payer: Medicaid Other | Admitting: Podiatry

## 2023-06-01 ENCOUNTER — Ambulatory Visit: Payer: Medicaid Other | Admitting: Podiatry

## 2023-11-18 ENCOUNTER — Other Ambulatory Visit: Payer: Self-pay

## 2023-11-18 ENCOUNTER — Emergency Department (HOSPITAL_BASED_OUTPATIENT_CLINIC_OR_DEPARTMENT_OTHER)
Admission: EM | Admit: 2023-11-18 | Discharge: 2023-11-18 | Disposition: A | Discharge status: Home / Self Care | Payer: Medicaid Other | Attending: Emergency Medicine | Admitting: Emergency Medicine

## 2023-11-18 ENCOUNTER — Emergency Department (HOSPITAL_BASED_OUTPATIENT_CLINIC_OR_DEPARTMENT_OTHER): Payer: Medicaid Other

## 2023-11-18 ENCOUNTER — Encounter (HOSPITAL_BASED_OUTPATIENT_CLINIC_OR_DEPARTMENT_OTHER): Payer: Self-pay

## 2023-11-18 DIAGNOSIS — R17 Unspecified jaundice: Secondary | ICD-10-CM | POA: Diagnosis not present

## 2023-11-18 DIAGNOSIS — Z79899 Other long term (current) drug therapy: Secondary | ICD-10-CM | POA: Insufficient documentation

## 2023-11-18 DIAGNOSIS — R110 Nausea: Secondary | ICD-10-CM | POA: Diagnosis not present

## 2023-11-18 DIAGNOSIS — D72819 Decreased white blood cell count, unspecified: Secondary | ICD-10-CM | POA: Diagnosis not present

## 2023-11-18 DIAGNOSIS — E876 Hypokalemia: Secondary | ICD-10-CM | POA: Diagnosis not present

## 2023-11-18 DIAGNOSIS — R1084 Generalized abdominal pain: Secondary | ICD-10-CM | POA: Insufficient documentation

## 2023-11-18 DIAGNOSIS — R109 Unspecified abdominal pain: Secondary | ICD-10-CM

## 2023-11-18 DIAGNOSIS — Z20822 Contact with and (suspected) exposure to covid-19: Secondary | ICD-10-CM | POA: Diagnosis not present

## 2023-11-18 LAB — COMPREHENSIVE METABOLIC PANEL
ALT: 16 U/L (ref 0–44)
AST: 22 U/L (ref 15–41)
Albumin: 4.3 g/dL (ref 3.5–5.0)
Alkaline Phosphatase: 44 U/L (ref 38–126)
Anion gap: 9 (ref 5–15)
BUN: 10 mg/dL (ref 6–20)
CO2: 26 mmol/L (ref 22–32)
Calcium: 9.3 mg/dL (ref 8.9–10.3)
Chloride: 106 mmol/L (ref 98–111)
Creatinine, Ser: 0.67 mg/dL (ref 0.44–1.00)
GFR, Estimated: >60 mL/min (ref >60)
Glucose, Bld: 87 mg/dL (ref 70–99)
Potassium: 3.3 mmol/L — ABNORMAL LOW (ref 3.5–5.1)
Sodium: 141 mmol/L (ref 135–145)
Total Bilirubin: 2 mg/dL — ABNORMAL HIGH (ref 0.0–1.2)
Total Protein: 8.1 g/dL (ref 6.5–8.1)

## 2023-11-18 LAB — CBC
HCT: 40.4 % (ref 36.0–46.0)
Hemoglobin: 13.4 g/dL (ref 12.0–15.0)
MCH: 30.1 pg (ref 26.0–34.0)
MCHC: 33.2 g/dL (ref 30.0–36.0)
MCV: 90.8 fL (ref 80.0–100.0)
Platelets: 293 10*3/uL (ref 150–400)
RBC: 4.45 MIL/uL (ref 3.87–5.11)
RDW: 13.6 % (ref 11.5–15.5)
WBC: 2.8 10*3/uL — ABNORMAL LOW (ref 4.0–10.5)
nRBC: 0 % (ref 0.0–0.2)

## 2023-11-18 LAB — PREGNANCY, URINE: Preg Test, Ur: NEGATIVE

## 2023-11-18 LAB — RESP PANEL BY RT-PCR (RSV, FLU A&B, COVID)  RVPGX2
Influenza A by PCR: NEGATIVE
Influenza B by PCR: NEGATIVE
Resp Syncytial Virus by PCR: NEGATIVE
SARS Coronavirus 2 by RT PCR: NEGATIVE

## 2023-11-18 LAB — URINALYSIS, ROUTINE W REFLEX MICROSCOPIC
Bilirubin Urine: NEGATIVE
Glucose, UA: NEGATIVE mg/dL
Hgb urine dipstick: NEGATIVE
Ketones, ur: NEGATIVE mg/dL
Leukocytes,Ua: NEGATIVE
Nitrite: NEGATIVE
Protein, ur: 30 mg/dL — AB
Specific Gravity, Urine: 1.02 (ref 1.005–1.030)
pH: >=9 (ref 5.0–8.0)

## 2023-11-18 LAB — URINALYSIS, MICROSCOPIC (REFLEX)
RBC / HPF: NONE SEEN RBC/hpf (ref 0–5)
WBC, UA: NONE SEEN WBC/hpf (ref 0–5)

## 2023-11-18 LAB — LIPASE, BLOOD: Lipase: 33 U/L (ref 11–51)

## 2023-11-18 MED ORDER — ONDANSETRON 4 MG PO TBDP
4.0000 mg | ORAL_TABLET | Freq: Three times a day (TID) | ORAL | 0 refills | Status: DC | PRN
Start: 2023-11-18 — End: 2024-07-04

## 2023-11-18 MED ORDER — POTASSIUM CHLORIDE CRYS ER 20 MEQ PO TBCR
40.0000 meq | EXTENDED_RELEASE_TABLET | Freq: Once | ORAL | Status: AC
  Administered 2023-11-18: 40 meq via ORAL
  Filled 2023-11-18: qty 2

## 2023-11-18 MED ORDER — IOHEXOL 300 MG/ML  SOLN
100.0000 mL | Freq: Once | INTRAMUSCULAR | Status: AC | PRN
  Administered 2023-11-18: 100 mL via INTRAVENOUS

## 2023-11-18 NOTE — ED Triage Notes (Signed)
 Pt reports abdominal pain since Sunday. Reports decreased appetite. Was seen at [facility] today. Reports nausea

## 2023-11-18 NOTE — Discharge Instructions (Addendum)
 It is unclear why you are having abdominal pain.  Your workup today shows no obvious cause.  Be sure to eat and drink appropriately and you may use ibuprofen  and/or Tylenol  to help with pain.  We are also giving you nausea medicine for your nausea.  You can follow-up your COVID, influenza, and RSV test in MyChart.  Follow-up with a primary care physician for both follow-up of this visit but also general medical care.  If you develop worsening, continued, or recurrent abdominal pain, uncontrolled vomiting, fever, chest or back pain, or any other new/concerning symptoms then return to the ER for evaluation.

## 2023-11-18 NOTE — ED Provider Notes (Signed)
 [facility] Provider Note   CSN: [id number] Arrival date & time: 11/18/23  1842     History  Chief Complaint  Patient presents with   Abdominal Pain    Karen Meyer is a 38 y.o. female.  HPI 38 year old female presents with abdominal pain.  Symptoms started 3 days ago.  She has had nausea, diffuse abdominal cramping, and decreased bowel movements.  She normally has about 2 bowel movements a day but has been having less and even 0 on a couple days.  No diarrhea, fever, vomiting.  The pain is pretty much constant but today has been the worst.  Does not seem to get worse with eating.  No cough.  No urinary symptoms or vaginal symptoms though she thinks she is about to start her cycle.  Home Medications Prior to Admission medications   Medication Sig Start Date End Date Taking? Authorizing Provider  ondansetron  (ZOFRAN -ODT) 4 MG disintegrating tablet Take 1 tablet (4 mg total) by mouth every 8 (eight) hours as needed for nausea or vomiting. 11/18/23  Yes Freddi Hamilton, MD  cetirizine  (ZYRTEC  ALLERGY) 10 MG tablet Take 1 tablet (10 mg total) by mouth at bedtime. 04/24/23   Raspet, Erin K, PA-C  triamcinolone  0.1% oint-Eucerin equivalent cream 1:1 mixture Apply topically 2 (two) times daily. Apply twice daily to lesions on lower extremities for up to 2 weeks. 04/24/23   Raspet, Erin K, PA-C      Allergies    Cymbalta [duloxetine hcl], Propoxyphene, Darvocet [propoxyphene n-acetaminophen ], and Niacin and related    Review of Systems   Review of Systems  Constitutional:  Negative for fever.  Respiratory:  Negative for cough.   Gastrointestinal:  Positive for abdominal pain, constipation and nausea. Negative for diarrhea and vomiting.  Genitourinary:  Negative for dysuria and vaginal bleeding.  Musculoskeletal:  Positive for back pain (low back pain).    Physical Exam Updated Vital Signs BP 123/82   Pulse 64   Temp 98.1 F (36.7 C)  (Oral)   Resp 17   Ht 5' 4 (1.626 m)   Wt 62.6 kg   LMP 10/25/2023   SpO2 100%   BMI 23.69 kg/m  Physical Exam Vitals and nursing note reviewed.  Constitutional:      General: She is not in acute distress.    Appearance: She is well-developed. She is not ill-appearing or diaphoretic.  HENT:     Head: Normocephalic and atraumatic.  Cardiovascular:     Rate and Rhythm: Normal rate and regular rhythm.     Heart sounds: Normal heart sounds.  Pulmonary:     Effort: Pulmonary effort is normal.     Breath sounds: Normal breath sounds.  Abdominal:     Palpations: Abdomen is soft.     Tenderness: There is generalized abdominal tenderness. There is no right CVA tenderness or left CVA tenderness.  Skin:    General: Skin is warm and dry.  Neurological:     Mental Status: She is alert.     ED Results / Procedures / Treatments   Labs (all labs ordered are listed, but only abnormal results are displayed) Labs Reviewed  COMPREHENSIVE METABOLIC PANEL - Abnormal; Notable for the following components:      Result Value   Potassium 3.3 (*)    Total Bilirubin 2.0 (*)    All other components within normal limits  CBC - Abnormal; Notable for the following components:   WBC 2.8 (*)  All other components within normal limits  URINALYSIS, ROUTINE W REFLEX MICROSCOPIC - Abnormal; Notable for the following components:   Protein, ur 30 (*)    All other components within normal limits  URINALYSIS, MICROSCOPIC (REFLEX) - Abnormal; Notable for the following components:   Bacteria, UA RARE (*)    All other components within normal limits  RESP PANEL BY RT-PCR (RSV, FLU A&B, COVID)  RVPGX2  LIPASE, BLOOD  PREGNANCY, URINE    EKG None  Radiology CT ABDOMEN PELVIS W CONTRAST Result Date: 11/18/2023 CLINICAL DATA:  Lower abdominal pain EXAM: CT ABDOMEN AND PELVIS WITH CONTRAST TECHNIQUE: Multidetector CT imaging of the abdomen and pelvis was performed using the standard protocol following  bolus administration of intravenous contrast. RADIATION DOSE REDUCTION: This exam was performed according to the departmental dose-optimization program which includes automated exposure control, adjustment of the mA and/or kV according to patient size and/or use of iterative reconstruction technique. CONTRAST:  OMNIPAQUE  IOHEXOL  300 MG/ML  SOLN COMPARISON:  10/12/2009 FINDINGS: Lower chest: No acute abnormality. Hepatobiliary: No focal hepatic abnormality. Gallbladder unremarkable. Pancreas: No focal abnormality or ductal dilatation. Spleen: No focal abnormality.  Normal size. Adrenals/Urinary Tract: No adrenal abnormality. No focal renal abnormality. No stones or hydronephrosis. Urinary bladder is unremarkable. Stomach/Bowel: Stomach, large and small bowel grossly unremarkable. Normal appendix. Vascular/Lymphatic: No evidence of aneurysm or adenopathy. Reproductive: Uterus and adnexa unremarkable.  No mass. Other: No free fluid or free air. Musculoskeletal: No acute bony abnormality. IMPRESSION: No acute findings in the abdomen or pelvis. Electronically Signed   By: Franky Crease M.D.   On: 11/18/2023 23:05    Procedures Procedures    Medications Ordered in ED Medications  potassium chloride  SA (KLOR-CON  M) CR tablet 40 mEq (40 mEq Oral Given 11/18/23 2235)  iohexol  (OMNIPAQUE ) 300 MG/ML solution 100 mL (100 mLs Intravenous Contrast Given 11/18/23 2203)    ED Course/ Medical Decision Making/ A&P                                 Medical Decision Making Amount and/or Complexity of Data Reviewed Labs: ordered.    Details: Mild leukopenia and mild hypokalemia.  Negative pregnancy test Radiology: ordered and independent interpretation performed.    Details: No appendicitis  Risk Prescription drug management.   Unclear why patient is having vague/diffuse abdominal pain.  No urinary symptoms and a negative UA.  No vaginal symptoms.  Has diffuse minimal tenderness.  No specific right upper  quadrant tenderness.  Her bilirubin is minimally elevated at 2 but her CT shows no evidence of gallbladder disease.  I doubt cholelithiasis/cholecystitis.  No appendicitis or other emergent condition.  Patient has declined pain/nausea treatment at this time.  She feels well enough to go, we will prescribe her Zofran  and otherwise recommend supportive care.  Discussed return precautions.        Final Clinical Impression(s) / ED Diagnoses Final diagnoses:  Abdominal pain, unspecified abdominal location    Rx / DC Orders ED Discharge Orders          Ordered    ondansetron  (ZOFRAN -ODT) 4 MG disintegrating tablet  Every 8 hours PRN        11/18/23 2311              Freddi Hamilton, MD 11/18/23 2315

## 2023-11-20 ENCOUNTER — Other Ambulatory Visit: Payer: Self-pay

## 2023-11-20 ENCOUNTER — Inpatient Hospital Stay (HOSPITAL_COMMUNITY)
Admission: AD | Admit: 2023-11-20 | Discharge: 2023-11-20 | Disposition: A | Discharge status: Home / Self Care | Payer: Medicaid Other | Attending: Obstetrics and Gynecology | Admitting: Obstetrics and Gynecology

## 2023-11-20 DIAGNOSIS — R109 Unspecified abdominal pain: Secondary | ICD-10-CM | POA: Insufficient documentation

## 2023-11-20 DIAGNOSIS — M549 Dorsalgia, unspecified: Secondary | ICD-10-CM | POA: Diagnosis not present

## 2023-11-20 DIAGNOSIS — B343 Parvovirus infection, unspecified: Secondary | ICD-10-CM

## 2023-11-20 DIAGNOSIS — R112 Nausea with vomiting, unspecified: Secondary | ICD-10-CM | POA: Diagnosis not present

## 2023-11-20 DIAGNOSIS — K529 Noninfective gastroenteritis and colitis, unspecified: Secondary | ICD-10-CM

## 2023-11-20 DIAGNOSIS — Z3202 Encounter for pregnancy test, result negative: Secondary | ICD-10-CM

## 2023-11-20 LAB — HCG, QUANTITATIVE, PREGNANCY: hCG, Beta Chain, Quant, S: <1 m[IU]/mL (ref <5)

## 2023-11-20 NOTE — MAU Note (Signed)
.  Karen Meyer is a 39 y.o. at Unknown here in MAU reporting: she's having abdominal cramping and back pain that began Sunday.  States cramping worsens when eating.  Reports she has N/V and unable to keep anything down. Reports had negative UPT. LMP: 10/25/2023 Onset of complaint: Sunday Pain score: 8 abdomen & 6 back Vitals:   11/20/23 1149  BP: 124/77  Pulse: 63  Resp: 18  Temp: 98 F (36.7 C)  SpO2: 100%     FHT:NA Lab orders placed from triage: None

## 2023-11-20 NOTE — MAU Provider Note (Signed)
 Chief Complaint: Abdominal Pain, Back Pain, Nausea, and Emesis (Abdominal )   Event Date/Time   First Provider Initiated Contact with Patient 11/20/23 1211      SUBJECTIVE HPI: Karen Meyer is a 38 y.o. G4P1011 who presents to maternity admissions for N/V and diffuse abdominal cramping since Sunday. LMP 12/15. She has had both positive and negative HPTs this week. She denies fever or sick contacts.   Past Medical History:  Diagnosis Date   Chronic pruritus    Fibromyalgia    Frequency of urination    GERD (gastroesophageal reflux disease)    Infertility, female    Interstitial cystitis    Migraine headache    Mild acid reflux    Nocturia    PONV (postoperative nausea and vomiting)    TMJ syndrome LEFT SIDE   Urgency of urination    Past Surgical History:  Procedure Laterality Date   CESAREAN SECTION  2007   CYSTO WITH HYDRODISTENSION  08/16/2012   Procedure: CYSTOSCOPY/HYDRODISTENSION;  Surgeon: Alm GORMAN Fragmin, MD;  Location: [facility];  Service: Urology;  Laterality: N/A;   CYSTO/ HOD/ INSTILLATION CLORPACTIN  03-10-2011;  MAR 2011;  2009   FOR [facility]   Social History   Socioeconomic History   Marital status: Divorced    Spouse name: Gladis   Number of children: 1   Years of education: 16   Highest education level: Not on file  Occupational History   Occupation: Chief Strategy Officer: [employer]    Comment: set designer  Tobacco Use   Smoking status: Never   Smokeless tobacco: Never  Vaping Use   Vaping status: Never Used  Substance and Sexual Activity   Alcohol use: Yes    Comment: RARE   Drug use: No   Sexual activity: Yes    Partners: Male    Birth control/protection: None  Other Topics Concern   Not on file  Social History Narrative   Definitely does not want to become pregnant again.   Social Drivers of Corporate Investment Banker Strain: Not on file  Food Insecurity: Not on file  Transportation Needs: Not on file   Physical Activity: Not on file  Stress: Not on file  Social Connections: Not on file  Intimate Partner Violence: Not on file   Current Facility-Administered Medications on File Prior to Encounter  Medication Dose Route Frequency Provider Last Rate Last Admin   bupivacaine  (MARCAINE ) 0.25 % (with pres) injection 30 mL  30 mL Infiltration Once Grapey, David, MD       oxychlorosene (CLORPACTIN WCS 90) powder   Irrigation Once Grapey, David, MD       Current Outpatient Medications on File Prior to Encounter  Medication Sig Dispense Refill   cetirizine  (ZYRTEC  ALLERGY) 10 MG tablet Take 1 tablet (10 mg total) by mouth at bedtime. 30 tablet 0   ondansetron  (ZOFRAN -ODT) 4 MG disintegrating tablet Take 1 tablet (4 mg total) by mouth every 8 (eight) hours as needed for nausea or vomiting. 10 tablet 0   triamcinolone  0.1% oint-Eucerin equivalent cream 1:1 mixture Apply topically 2 (two) times daily. Apply twice daily to lesions on lower extremities for up to 2 weeks. 120 g 0   Allergies  Allergen Reactions   Cymbalta [Duloxetine Hcl] Shortness Of Breath    Difficulty Breathing    Propoxyphene Hives   Darvocet [Propoxyphene N-Acetaminophen ] Hives   Niacin And Related Hives    ROS:  Review of Systems  Constitutional:  Negative for fever.  Gastrointestinal:  Positive for abdominal pain, constipation, nausea and vomiting. Negative for diarrhea.    I have reviewed patient's Past Medical Hx, Surgical Hx, Family Hx, Social Hx, medications and allergies.   Physical Exam  Patient Vitals for the past 24 hrs:  BP Temp Temp src Pulse Resp SpO2 Height Weight  11/20/23 1149 124/77 98 F (36.7 C) Oral 63 18 100 % -- --  11/20/23 1143 -- -- -- -- -- -- 5' 4 (1.626 m) 62.8 kg   Physical Exam Vitals and nursing note reviewed.  Constitutional:      General: She is not in acute distress.    Appearance: She is normal weight.  Cardiovascular:     Rate and Rhythm: Normal rate.  Pulmonary:      Effort: Pulmonary effort is normal.  Abdominal:     General: Abdomen is flat. There is no distension.  Skin:    General: Skin is warm and dry.     Findings: No erythema.  Neurological:     Mental Status: She is alert and oriented to person, place, and time.  Psychiatric:        Mood and Affect: Mood normal.     MDM Patient denies any concerning symptoms in need of emergent evaluation. Patient has been seen in the ED twice in the last 2 days and was given Zofran . She has not tried this yet.   ASSESSMENT MSE Complete Non pregnant   PLAN Discharge patient Advised to try Zofran  as prescribed for symptoms  Follow-up with ED or UC as needed  If symptoms persist, consider GI evaluation.   Larwence Mliss SAILOR, PA-C 11/20/2023 12:12 PM

## 2024-02-11 ENCOUNTER — Encounter: Payer: Self-pay | Admitting: Dermatology

## 2024-02-11 ENCOUNTER — Ambulatory Visit: Payer: Medicaid Other | Admitting: Dermatology

## 2024-02-11 VITALS — BP 155/76 | HR 68

## 2024-02-11 DIAGNOSIS — L300 Nummular dermatitis: Secondary | ICD-10-CM | POA: Diagnosis not present

## 2024-02-11 MED ORDER — CLOBETASOL PROPIONATE 0.05 % EX OINT: 1.0000 | TOPICAL_OINTMENT | Freq: Two times a day (BID) | CUTANEOUS | 2 refills | Status: DC

## 2024-02-11 NOTE — Progress Notes (Signed)
   New Patient Visit   Subjective  Karen Meyer is a 38 y.o. female who presents for the following: Patient here concerned about a rash.  Patient states she has a history of Allodynia and believes the rash stems from that.  She states that about 3 years ago her skin changed and now everything irritates it.  Patient went to urgent care to seek treatment for rash.  They prescribed Triamcinolone.  Patient has only used it a few times.  Patient also only uses coco butter for moisturizer and Aveeno body wash.  The following portions of the chart were reviewed this encounter and updated as appropriate: medications, allergies, medical history  Review of Systems:  No other skin or systemic complaints except as noted in HPI or Assessment and Plan.  Objective  Well appearing patient in no apparent distress; mood and affect are within normal limits.   A focused examination was performed of the following areas: Face, neck, and legs  Relevant exam findings are noted in the Assessment and Plan.       Assessment & Plan   ATOPIC NUMMULAR DERMATITIS Exam: Scaly pink papules coalescing to plaques 5% BSA  Not at goal vs flared   Pt Education discussed during Visit: Atopic dermatitis (eczema) is a chronic, relapsing, pruritic condition that can significantly affect quality of life. It is often associated with allergic rhinitis and/or asthma and can require treatment with topical medications, phototherapy, or in severe cases biologic injectable medication (Dupixent; Adbry) or Oral JAK inhibitors.  - Assessment: Patient presents with a history of itchy skin that started about a year ago, followed by the appearance of dark patches. Condition is exacerbated by allodynia. Diagnosed as nummular eczema (nummular dermatitis), a type of eczema that can occur later in life. Previous treatment with triamcinolone has been ineffective.  - Plan:    Prescribe clobetasol ointment, to be applied twice  daily for 2 weeks    After 2 weeks, discontinue clobetasol to prevent skin thinning    During clobetasol breaks, use Aquaphor or Aveeno Eczema Balm    Recommend Aveeno Tolerance (preservative-free) for sensitive skin    Advise patient to avoid fragranced products and manage stress    Follow-up appointment in 3 months to assess treatment efficacy and discuss additional treatment options if necessary    No follow-ups on file.  IManual Meier, Surg Tech III, am acting as scribe for [employer], DO.   Documentation: I have reviewed the above documentation for accuracy and completeness, and I agree with the above.  Langston Reusing, DO

## 2024-02-11 NOTE — Patient Instructions (Addendum)
 Hello Harlow Ohms,  Thank you for visiting today. Here is a summary of the key instructions:  - Medications:   - Use clobetasol ointment twice a day for 2 weeks   - After 2 weeks, take a break from clobetasol   - During the break, use Aquaphor or Aveeno Eczema Balm   - Consider using Avene Tolerance for sensitive skin  - Skin Care:   - Avoid Bath and Body Works fragrances   - Try Aveeno baby products if worried about irritation  - Lifestyle:   - Manage stress to help reduce symptoms  - Follow-up:   - Return for a follow-up appointment in 3 months  Please reach out if you have any questions or concerns.  Best Regards,  Dr. Langston Reusing Dermat[facility]

## 2024-05-16 ENCOUNTER — Ambulatory Visit (INDEPENDENT_AMBULATORY_CARE_PROVIDER_SITE_OTHER): Admitting: Dermatology

## 2024-05-16 ENCOUNTER — Encounter: Payer: Self-pay | Admitting: Dermatology

## 2024-05-16 VITALS — BP 134/86 | HR 63

## 2024-05-16 DIAGNOSIS — L209 Atopic dermatitis, unspecified: Secondary | ICD-10-CM | POA: Diagnosis not present

## 2024-05-16 DIAGNOSIS — L300 Nummular dermatitis: Secondary | ICD-10-CM

## 2024-05-16 DIAGNOSIS — L810 Postinflammatory hyperpigmentation: Secondary | ICD-10-CM | POA: Diagnosis not present

## 2024-05-16 NOTE — Patient Instructions (Addendum)
 Date: Mon May 16 2024  Hello Nieman, Navjot,  Thank you for visiting today. Here is a summary of the key instructions:  - Medications:   - Use clobetasol  ointment twice a day for 2 weeks   - After 2 weeks, switch to a thick balm like Aquaphor or Aveeno Eczema Balm  - Skin Care:   - Keep skin hydrated with oil, moisturizers, or balms   - Avoid irritants like fragrances   - Use sunscreen at the beach to prevent dark spots from getting darker  - Samples:   - EucerinAdvanced Repair Cream   - LaRoche-Posay Lipikar Triple Moisture Heavy Cream  - Follow-up:   - Return for a follow-up appointment in November   - Call the office if you run out of your prescription before the follow-up  Please reach out if you have any questions or concerns.  Warm regards,  Dr. Delon Lenis Dermat[facility]  Important Information  Due to recent changes in healthcare laws, you may see results of your pathology and/or laboratory studies on MyChart before the doctors have had a chance to review them. We understand that in some cases there may be results that are confusing or concerning to you. Please understand that not all results are received at the same time and often the doctors may need to interpret multiple results in order to provide you with the best plan of care or course of treatment. Therefore, we ask that you please give us  2 business days to thoroughly review all your results before contacting the office for clarification. Should we see a critical lab result, you will be contacted sooner.   If You Need Anything After Your Visit  If you have any questions or concerns for your doctor, please call our main line at [phone] If no one answers, please leave a voicemail as directed and we will return your call as soon as possible. Messages left after 4 pm will be answered the following business day.   You may also send us  a message via MyChart. We typically respond to MyChart messages within 1-2  business days.  For prescription refills, please ask your pharmacy to contact our office. Our fax number is [phone].  If you have an urgent issue when the clinic is closed that cannot wait until the next business day, you can page your doctor at the number below.    Please note that while we do our best to be available for urgent issues outside of office hours, we are not available 24/7.   If you have an urgent issue and are unable to reach us , you may choose to seek medical care at your doctor's office, retail clinic, urgent care center, or emergency room.  If you have a medical emergency, please immediately call 911 or go to the emergency department. In the event of inclement weather, please call our main line at [phone] for an update on the status of any delays or closures.  Dermatology Medication Tips: Please keep the boxes that topical medications come in in order to help keep track of the instructions about where and how to use these. Pharmacies typically print the medication instructions only on the boxes and not directly on the medication tubes.   If your medication is too expensive, please contact our office at [phone] or send us  a message through MyChart.   We are unable to tell what your co-pay for medications will be in advance as this is different depending on your insurance coverage. However,  we may be able to find a substitute medication at lower cost or fill out paperwork to get insurance to cover a needed medication.   If a prior authorization is required to get your medication covered by your insurance company, please allow us  1-2 business days to complete this process.  Drug prices often vary depending on where the prescription is filled and some pharmacies may offer cheaper prices.  The website www.goodrx.com contains coupons for medications through different pharmacies. The prices here do not account for what the cost may be with help from insurance (it may  be cheaper with your insurance), but the website can give you the price if you did not use any insurance.  - You can print the associated coupon and take it with your prescription to the pharmacy.  - You may also stop by our office during regular business hours and pick up a GoodRx coupon card.  - If you need your prescription sent electronically to a different pharmacy, notify our office through [facility] or by phone at [phone]

## 2024-05-16 NOTE — Progress Notes (Signed)
   Follow-Up Visit   Subjective  Karen Meyer is a 38 y.o. female who presents for the following: follow up for treatment of atopic dermatitis  Karen Meyer presents for follow-up of eczema. She reports initial use of prescribed clobetasol  ointment but discontinued it as her symptoms improved. The patient was uncertain about the duration of use for the medication.  The patient's eczema is described as a chronic condition. Currently, her skin shows no active flares, but dark spots from previous flares are present. She mentions that weather and stress can trigger flares of her condition. The patient has been using moisturizers, but it's noted that she needs to use a thicker balm rather than lotion for better efficacy given her skin type.  Regarding treatment adherence, the patient stopped using clobetasol  prematurely when her symptoms improved, indicating a need for education on proper medication use. She has been attempting to keep her skin hydrated with moisturizers, but has not been using the most effective products for her condition.  The following portions of the chart were reviewed this encounter and updated as appropriate: medications, allergies, medical history  Review of Systems:  No other skin or systemic complaints except as noted in HPI or Assessment and Plan.  Objective  Well appearing patient in no apparent distress; mood and affect are within normal limits.   A focused examination was performed of the following areas: Bilateral lower legs  Relevant exam findings are noted in the Assessment and Plan.       Assessment & Plan  ATOPIC DERMATITIS w/ PIH Exam: No active flares, + brown patches in areas of recent flares   Not at goal  Atopic dermatitis (eczema) is a chronic, relapsing, pruritic condition that can significantly affect quality of life. It is often associated with allergic rhinitis and/or asthma and can require treatment with topical medications,  phototherapy, or in severe cases biologic injectable medication (Dupixent; Adbry) or Oral JAK inhibitors.  - Assessment: Patient has a history of eczema, a chronic condition. Recently used clobetasol  ointment but discontinued as symptoms improved. Currently, no active flares, only residual dark spots from previous flares. Patient was unclear about proper duration of clobetasol  use. Eczema flares can be triggered by weather changes and stress. Patient's skin type requires thicker moisturizers for effective management.  - Plan:    Medication regimen:     - Use clobetasol  ointment twice daily for 2 weeks     - After 2 weeks, switch to a thick balm (e.g., Aquaphor or Aveeno Eczema Balm)    Skincare recommendations:     - Maintain skin hydration with oil, moisturizers, or balms     - Avoid irritants, particularly fragrances     - Use sunscreen at the beach to prevent darkening of existing spots    Samples provided:     - Excedrin Advanced Repair Cream     - LaRoche-Posay Lipikar Triple Moisture Heavy Cream    Follow-up:     - Appointment scheduled for November     - Contact office if prescription refill needed before next appointment  Recommend gentle skin care.      Return in about 4 months (around 09/16/2024) for follow up for eczema .  IBerwyn Lesches, Surg Tech III, am acting as scribe for [employer], DO.   Documentation: I have reviewed the above documentation for accuracy and completeness, and I agree with the above.  Delon Lenis, DO

## 2024-07-04 ENCOUNTER — Other Ambulatory Visit: Payer: Self-pay

## 2024-07-04 ENCOUNTER — Ambulatory Visit
Admission: EM | Admit: 2024-07-04 | Discharge: 2024-07-04 | Disposition: A | Discharge status: Home / Self Care | Attending: Nurse Practitioner | Admitting: Nurse Practitioner

## 2024-07-04 DIAGNOSIS — M436 Torticollis: Secondary | ICD-10-CM

## 2024-07-04 MED ORDER — CYCLOBENZAPRINE HCL 10 MG PO TABS: 10.0000 mg | ORAL_TABLET | Freq: Three times a day (TID) | ORAL | 0 refills | Status: AC

## 2024-07-04 MED ORDER — PREDNISONE 10 MG PO TABS: 10.0000 mg | ORAL_TABLET | Freq: Every day | ORAL | 0 refills | Status: AC

## 2024-07-04 MED ORDER — KETOROLAC TROMETHAMINE 60 MG/2ML IM SOLN
60.0000 mg | Freq: Once | INTRAMUSCULAR | Status: AC
  Administered 2024-07-04: 60 mg via INTRAMUSCULAR

## 2024-07-04 NOTE — ED Triage Notes (Signed)
 Back of neck pain x 3 days, no injury. Sts she may have slept wrong on it. Has been using biofreeze, heating pads.

## 2024-07-04 NOTE — Discharge Instructions (Addendum)
 You were seen today for neck pain that started after sleeping on the couch. This type of pain is called acute torticollis and happens when the muscles in the neck spasm from being in an awkward position. It is not dangerous, but it can be painful and take several days to improve.  You received an injection of Toradol  in the clinic for pain relief and have been prescribed a short course of Prednisone  (steroid) and Flexeril  (muscle relaxer) to help relax the muscle. Take Flexeril  three times daily with food. Do not drive or operate machinery after taking it, as it may cause drowsiness. Prednisone  should be taken as directed each day until the course is complete.  To help with your recovery, apply moist heat to your neck several times a day by placing a damp towel over the area and then a heating pad on a low setting. Gentle range of motion exercises, such as slowly turning your head from side to side, can also help relax the muscle. Avoid sudden or jerking movements of the neck. Improvement should begin over the next few days and continue through the week.  Follow up with your primary care provider if your symptoms do not improve as expected or if the pain worsens. Go to the emergency department right away if you develop new numbness, weakness, difficulty moving your arms or legs, trouble walking, or severe headache.

## 2024-07-04 NOTE — ED Provider Notes (Signed)
 [facility]    CSN: [id number] Arrival date & time: 07/04/24  1347      History   Chief Complaint Chief Complaint  Patient presents with   Torticollis    HPI Karen Meyer is a 38 y.o. female.   Discussed the use of AI scribe software for clinical note transcription with the patient, who gave verbal consent to proceed.   The patient presents with acute neck pain that began on Saturday morning. The patient reports no specific injury but mentions sleeping on the couch Friday night prior to the onset of pain.  The patient describes pain with certain neck movements, particularly when turning her head to one side. Chin-to-chest movement causes less discomfort, while tilting the chin up provides some relief. There is no associated numbness or tingling down the arms. The patient denies experiencing fevers or chills.  No other treatments have been attempted. The patient reports that ibuprofen  tears my stomach up, even when taken with food.   The following portions of the patient's history were reviewed and updated as appropriate: allergies, current medications, past family history, past medical history, past social history, past surgical history, and problem list.    Past Medical History:  Diagnosis Date   Chronic pruritus    Fibromyalgia    Frequency of urination    GERD (gastroesophageal reflux disease)    Infertility, female    Interstitial cystitis    Migraine headache    Mild acid reflux    Nocturia    PONV (postoperative nausea and vomiting)    TMJ syndrome LEFT SIDE   Urgency of urination     Patient Active Problem List   Diagnosis Date Noted   Chronic fatigue syndrome 01/02/2020   Pregnancy examination or test, negative result 06/26/2018   Pregnancy with uncertain fetal viability 11/29/2015   Fibromyalgia 08/14/2011   Irritable bowel syndrome 08/14/2011   Leukocytopenia 08/08/2010   APHTHOUS STOMATITIS 08/08/2010   MYALGIA 08/08/2010    CHANGE IN BOWELS 08/08/2010   Parvovirus B19 infection 07/31/2010   Temporomandibular joint disorder 07/31/2010   GERD 07/31/2010   Personal history of urinary disorder 07/31/2010   CYSTITIS, HX OF 07/31/2010   THRUSH 07/30/2010    Past Surgical History:  Procedure Laterality Date   CESAREAN SECTION  2007   CYSTO WITH HYDRODISTENSION  08/16/2012   Procedure: CYSTOSCOPY/HYDRODISTENSION;  Surgeon: Alm GORMAN Fragmin, MD;  Location: [facility];  Service: Urology;  Laterality: N/A;   CYSTO/ HOD/ INSTILLATION CLORPACTIN  03-10-2011;  MAR 2011;  2009   FOR [facility].    OB History     Gravida  4   Para  1   Term  1   Preterm  0   AB  1   Living  1      SAB  1   IAB  0   Ectopic  0   Multiple  0   Live Births               Home Medications    Prior to Admission medications   Medication Sig Start Date End Date Taking? Authorizing Provider  cyclobenzaprine  (FLEXERIL ) 10 MG tablet Take 1 tablet (10 mg total) by mouth 3 (three) times daily for 7 days. 07/04/24 07/11/24 Yes Matther Labell, FNP  predniSONE  (DELTASONE ) 10 MG tablet Take 1 tablet (10 mg total) by mouth daily with breakfast. Take 4 tablets (40 mg) by mouth each morning on days 1 and 2, then take 3 tablets (  30 mg) each morning on days 3 and 4, then take 2 tablets (20 mg) each morning on days 5 and 6, then take 1 tablet (10 mg) in the morning on day 7. 07/04/24  Yes Iola Lukes, FNP    Family History Family History  Problem Relation Age of Onset   Diabetes Mother    Cancer Mother 68       2003; BREAST   Diverticulosis Mother    Hyperlipidemia Father     Social History Social History   Tobacco Use   Smoking status: Never   Smokeless tobacco: Never  Vaping Use   Vaping status: Never Used  Substance Use Topics   Alcohol use: Yes    Comment: RARE   Drug use: No     Allergies   Cymbalta [duloxetine hcl], Propoxyphene, Darvocet [propoxyphene n-acetaminophen ], and Niacin and  related   Review of Systems Review of Systems  Musculoskeletal:  Positive for neck pain and neck stiffness.  Neurological:  Negative for weakness and numbness.  All other systems reviewed and are negative.    Physical Exam Triage Vital Signs ED Triage Vitals  Encounter Vitals Group     BP 07/04/24 1359 122/82     Girls Systolic BP Percentile --      Girls Diastolic BP Percentile --      Boys Systolic BP Percentile --      Boys Diastolic BP Percentile --      Pulse Rate 07/04/24 1359 87     Resp 07/04/24 1359 16     Temp 07/04/24 1359 98.9 F (37.2 C)     Temp src --      SpO2 07/04/24 1359 100 %     Weight --      Height --      Head Circumference --      Peak Flow --      Pain Score 07/04/24 1402 8     Pain Loc --      Pain Education --      Exclude from Growth Chart --    No data found.  Updated Vital Signs BP 122/82   Pulse 87   Temp 98.9 F (37.2 C)   Resp 16   LMP 06/04/2024 (Approximate)   SpO2 100%   Breastfeeding No   Visual Acuity Right Eye Distance:   Left Eye Distance:   Bilateral Distance:    Right Eye Near:   Left Eye Near:    Bilateral Near:     Physical Exam Vitals reviewed.  Constitutional:      General: She is awake. She is not in acute distress.    Appearance: Normal appearance. She is well-developed. She is not ill-appearing, toxic-appearing or diaphoretic.  HENT:     Head: Normocephalic.     Right Ear: Hearing normal.     Left Ear: Hearing normal.     Nose: Nose normal.     Mouth/Throat:     Mouth: Mucous membranes are moist.  Eyes:     General: Vision grossly intact.     Conjunctiva/sclera: Conjunctivae normal.  Cardiovascular:     Rate and Rhythm: Normal rate and regular rhythm.     Heart sounds: Normal heart sounds.  Pulmonary:     Effort: Pulmonary effort is normal.     Breath sounds: Normal breath sounds and air entry.  Musculoskeletal:     Cervical back: Neck supple. Torticollis present. No edema, erythema,  rigidity or crepitus. Pain with movement and muscular  tenderness present. No spinous process tenderness. Decreased range of motion.  Skin:    General: Skin is warm and dry.  Neurological:     General: No focal deficit present.     Mental Status: She is alert and oriented to person, place, and time.  Psychiatric:        Speech: Speech normal.        Behavior: Behavior is cooperative.      UC Treatments / Results  Labs (all labs ordered are listed, but only abnormal results are displayed) Labs Reviewed - No data to display  EKG   Radiology No results found.  Procedures Procedures (including critical care time)  Medications Ordered in UC Medications  ketorolac  (TORADOL ) injection 60 mg (60 mg Intramuscular Given 07/04/24 1511)    Initial Impression / Assessment and Plan / UC Course  I have reviewed the triage vital signs and the nursing notes.  Pertinent labs & imaging results that were available during my care of the patient were reviewed by me and considered in my medical decision making (see chart for details).    Patient presents with left-sided neck pain that began Saturday morning after sleeping on the couch Friday night. He denies numbness, tingling, or history of injury. Exam shows pain with left lateral rotation and extension of the neck. Findings are consistent with acute torticollis, likely due to sternocleidomastoid muscle spasm from sleeping in an awkward position. Treatment included an intramuscular Toradol  injection for pain relief, a five-day course of prednisone , and Flexeril  to be taken three times daily with food. Supportive measures with moist heat and gentle range of motion exercises were recommended, along with avoidance of sudden neck movements. Symptoms are expected to gradually improve over the next week. Patient was advised to follow up with his PCP if symptoms do not improve as expected or to seek urgent care for worsening pain, neurological deficits, or  new concerning symptoms.  Today's evaluation has revealed no signs of a dangerous process. Discussed diagnosis with patient and/or guardian. Patient and/or guardian aware of their diagnosis, possible red flag symptoms to watch out for and need for close follow up. Patient and/or guardian understands verbal and written discharge instructions. Patient and/or guardian comfortable with plan and disposition.  Patient and/or guardian has a clear mental status at this time, good insight into illness (after discussion and teaching) and has clear judgment to make decisions regarding their care  Documentation was completed with the aid of voice recognition software. Transcription may contain typographical errors.    Final Clinical Impressions(s) / UC Diagnoses   Final diagnoses:  Acute torticollis     Discharge Instructions      You were seen today for neck pain that started after sleeping on the couch. This type of pain is called acute torticollis and happens when the muscles in the neck spasm from being in an awkward position. It is not dangerous, but it can be painful and take several days to improve.  You received an injection of Toradol  in the clinic for pain relief and have been prescribed a short course of Prednisone  (steroid) and Flexeril  (muscle relaxer) to help relax the muscle. Take Flexeril  three times daily with food. Do not drive or operate machinery after taking it, as it may cause drowsiness. Prednisone  should be taken as directed each day until the course is complete.  To help with your recovery, apply moist heat to your neck several times a day by placing a damp towel over the area  and then a heating pad on a low setting. Gentle range of motion exercises, such as slowly turning your head from side to side, can also help relax the muscle. Avoid sudden or jerking movements of the neck. Improvement should begin over the next few days and continue through the week.  Follow up with your  primary care provider if your symptoms do not improve as expected or if the pain worsens. Go to the emergency department right away if you develop new numbness, weakness, difficulty moving your arms or legs, trouble walking, or severe headache.     ED Prescriptions     Medication Sig Dispense Auth. Provider   predniSONE  (DELTASONE ) 10 MG tablet Take 1 tablet (10 mg total) by mouth daily with breakfast. Take 4 tablets (40 mg) by mouth each morning on days 1 and 2, then take 3 tablets (30 mg) each morning on days 3 and 4, then take 2 tablets (20 mg) each morning on days 5 and 6, then take 1 tablet (10 mg) in the morning on day 7. 10 tablet Iola Lukes, FNP   cyclobenzaprine  (FLEXERIL ) 10 MG tablet Take 1 tablet (10 mg total) by mouth 3 (three) times daily for 7 days. 21 tablet Iola Lukes, FNP      PDMP not reviewed this encounter.   Iola [place], [place] 07/07/24 [phone]

## 2024-08-03 ENCOUNTER — Ambulatory Visit: Admitting: Internal Medicine

## 2024-10-25 ENCOUNTER — Ambulatory Visit: Admitting: Dermatology

## 2024-10-25 ENCOUNTER — Encounter: Payer: Self-pay | Admitting: Dermatology

## 2024-10-25 VITALS — BP 117/79 | HR 83

## 2024-10-25 DIAGNOSIS — L209 Atopic dermatitis, unspecified: Secondary | ICD-10-CM | POA: Diagnosis not present

## 2024-10-25 DIAGNOSIS — L299 Pruritus, unspecified: Secondary | ICD-10-CM

## 2024-10-25 DIAGNOSIS — Z7189 Other specified counseling: Secondary | ICD-10-CM

## 2024-10-25 DIAGNOSIS — L300 Nummular dermatitis: Secondary | ICD-10-CM

## 2024-10-25 MED ORDER — CLOBETASOL PROPIONATE 0.05 % EX OINT: 1.0000 | TOPICAL_OINTMENT | Freq: Two times a day (BID) | CUTANEOUS | 0 refills | Status: AC

## 2024-10-25 MED ORDER — DUPIXENT 300 MG/2ML ~~LOC~~ SOAJ: 600.0000 mg | Freq: Once | SUBCUTANEOUS | 0 refills | Status: AC

## 2024-10-25 MED ORDER — DUPIXENT 300 MG/2ML ~~LOC~~ SOAJ: 300.0000 mg | SUBCUTANEOUS | 12 refills | Status: AC

## 2024-10-25 NOTE — Progress Notes (Signed)
 "  Follow-Up Visit  Patient (and/or pt guardian) consented to the use of AI-assisted tools for note generation.    Subjective  Karen Meyer is a 38 y.o. female who presents for the following: Atopic Dermatitis  Patient was last evaluated on 05/16/24.  At this visit patient was advised to continue Clobetasol  0.05% ointment twice daily for two weeks on and two weeks off Recommended use of a thick moisturizer/balm such as Aquaphor Advanced repair cream or Aveeno Eczema balm Patient reports that itchiness is better when using clobetasol  and areas appear less irritated but she states that when she stops the ointment for a break, the itching and irritation comes back. Patient repots she is using the Clobetasol  for two weeks on and two weeks off and is using almond oil mixed with natural cocoa butter as a moisturizer Patient reports she is not using any of the previously recommended balms or moisturizers  Patient denies medication changes.  Patient has tried and failed:  Clobetasol  0.05% ointment Prednisone   The following portions of the chart were reviewed this encounter and updated as appropriate: medications, allergies, medical history  Review of Systems:  No other skin or systemic complaints except as noted in HPI or Assessment and Plan.  Objective  Well appearing patient in no apparent distress; mood and affect are within normal limits.  A focused examination was performed of the following areas: Bilateral Lower Extremities   Relevant exam findings are noted in the Assessment and Plan.             Assessment & Plan  ATOPIC DERMATITIS Exam: Scaly pink papules coalescing to plaques 15% BSA, IGA3  Improved  Atopic dermatitis (eczema) is a chronic, relapsing, pruritic condition that can significantly affect quality of life. It is often associated with allergic rhinitis and/or asthma and can require treatment with topical medications, phototherapy, or in severe cases  biologic injectable medication (Dupixent ; Adbry) or Oral JAK inhibitors.  Chronic nummular dermatitis with frequent flares, exacerbated by winter weather and dryness. Current treatment with clobetasol  provides partial relief, but flares recur quickly. She uses almond oil and cocoa butter for moisture but requires a heavier ointment to lock in moisture. Discussed the potential use of Dupixent , a monoclonal antibody, to control inflammation and reduce flare frequency. Dupixent  is effective, does not suppress the immune system, and is approved for eczema in children as young as six months. She expressed interest in trying Dupixent  after discussing its benefits and low risk of allergic reaction, despite a past allergic reaction to Xolair. - Sent prescription for Dupixent  with refills. - Initiated therapist, occupational process for Dupixent , expected to take about three weeks. - Will schedule an appointment for Dupixent  administration once approved. - Continue using clobetasol  as a first-line treatment before applying other moisturizers. - Use heavy-duty ointments like Vaseline or Aquaphor to lock in moisture. - Scheduled follow-up appointment in four months.   Plan: Dupixent  Initiation Indications:  Patient isn't a candidate for systemic therapy with methotrexate  or cyclosporine. Patient has been unresponsive to aggressive topical therapy.  Failed Treatments: Topical Steroids and Topical Protopic  Treatment Protocol: 600 mg [place] day 0 then 300 mg [place] every other week  Specific Contraindications Cyclosporine is contraindicated because the patient will not be able to complete the necessary follow-up labs. Methotrexate  is contraindicated because the patient will not be able to complete the necessary follow-up labs. Phototherapy is contraindicated because the patient lives too far from the treatment location.   Dupixent  Counseling: I discussed with  the patient the risks of dupilumab  including but not  limited to eye infection and irritation, cold sores, injection site reactions, worsening of asthma, allergic reactions and increased risk of parasitic infection. Live vaccines should be avoided while taking dupilumab . Dupilumab  will also interact with certain medications such as warfarin and cyclosporine. The patient understands that monitoring is required and they must alert us  or the primary physician if symptoms of infection or other concerning signs are noted.  Dupixent  Monitoring: There is no laboratory monitoring requirement with Dupixent .   NUMMULAR DERMATITIS   This Visit - clobetasol  ointment (TEMOVATE ) 0.05 % - Apply 1 Application topically 2 (two) times daily. ATOPIC DERMATITIS, UNSPECIFIED TYPE   This Visit - Dupilumab  (DUPIXENT ) 300 MG/2ML SOAJ - Inject 600 mg into the skin once for 1 dose. On day 1. - Dupilumab  (DUPIXENT ) 300 MG/2ML SOAJ - Inject 300 mg into the skin every 14 (fourteen) days. Starting at day 15 for maintenance.  Return in about 6 months (around 04/25/2025) for Eczema F/U.  LILLETTE Lyle Cords, am acting as a neurosurgeon for [employer], DO .   Documentation: I have reviewed the above documentation for accuracy and completeness, and I agree with the above.  Delon Lenis, DO   "

## 2024-10-25 NOTE — Patient Instructions (Addendum)
 VISIT SUMMARY:  Today, we discussed your worsening eczema flares, which have become more frequent and severe, especially during the winter months. We reviewed your current treatment regimen and explored additional options to better manage your condition.  YOUR PLAN:  -NUMMULAR DERMATITIS:  Nummular dermatitis is a chronic skin condition characterized by coin-shaped spots that can be itchy and inflamed. Your current treatment with clobetasol  provides some relief, but the flares return quickly.   We discussed starting Dupixent , a medication that helps control inflammation and reduce flare frequency. Dupixent  is effective and has a low risk of allergic reactions. We have sent a prescription for Dupixent  and initiated the insurance approval process, which may take about three weeks. Once approved, we will schedule an appointment for its administration.   Continue using clobetasol  ointment as your first-line treatment and apply heavy-duty ointments like Vaseline or Aquaphor to lock in moisture.  INSTRUCTIONS:  We have scheduled a follow-up appointment in four months to monitor your progress. Please continue using your current treatments and let us  know if you experience any issues or have any questions.    Important Information  Due to recent changes in healthcare laws, you may see results of your pathology and/or laboratory studies on MyChart before the doctors have had a chance to review them. We understand that in some cases there may be results that are confusing or concerning to you. Please understand that not all results are received at the same time and often the doctors may need to interpret multiple results in order to provide you with the best plan of care or course of treatment. Therefore, we ask that you please give us  2 business days to thoroughly review all your results before contacting the office for clarification. Should we see a critical lab result, you will be contacted  sooner.   If You Need Anything After Your Visit  If you have any questions or concerns for your doctor, please call our main line at [phone] If no one answers, please leave a voicemail as directed and we will return your call as soon as possible. Messages left after 4 pm will be answered the following business day.   You may also send us  a message via MyChart. We typically respond to MyChart messages within 1-2 business days.  For prescription refills, please ask your pharmacy to contact our office. Our fax number is [phone].  If you have an urgent issue when the clinic is closed that cannot wait until the next business day, you can page your doctor at the number below.    Please note that while we do our best to be available for urgent issues outside of office hours, we are not available 24/7.   If you have an urgent issue and are unable to reach us , you may choose to seek medical care at your doctor's office, retail clinic, urgent care center, or emergency room.  If you have a medical emergency, please immediately call 911 or go to the emergency department. In the event of inclement weather, please call our main line at [phone] for an update on the status of any delays or closures.  Dermatology Medication Tips: Please keep the boxes that topical medications come in in order to help keep track of the instructions about where and how to use these. Pharmacies typically print the medication instructions only on the boxes and not directly on the medication tubes.   If your medication is too expensive, please contact our office at [phone] or send  us  a message through MyChart.   We are unable to tell what your co-pay for medications will be in advance as this is different depending on your insurance coverage. However, we may be able to find a substitute medication at lower cost or fill out paperwork to get insurance to cover a needed medication.   If a prior authorization is  required to get your medication covered by your insurance company, please allow us  1-2 business days to complete this process.  Drug prices often vary depending on where the prescription is filled and some pharmacies may offer cheaper prices.  The website www.goodrx.com contains coupons for medications through different pharmacies. The prices here do not account for what the cost may be with help from insurance (it may be cheaper with your insurance), but the website can give you the price if you did not use any insurance.  - You can print the associated coupon and take it with your prescription to the pharmacy.  - You may also stop by our office during regular business hours and pick up a GoodRx coupon card.  - If you need your prescription sent electronically to a different pharmacy, notify our office through [facility] or by phone at [phone]

## 2024-11-14 ENCOUNTER — Encounter: Payer: Self-pay | Admitting: Podiatry

## 2024-11-14 ENCOUNTER — Telehealth: Payer: Self-pay | Admitting: Dermatology

## 2024-11-14 ENCOUNTER — Ambulatory Visit: Admitting: Podiatry

## 2024-11-14 DIAGNOSIS — M722 Plantar fascial fibromatosis: Secondary | ICD-10-CM

## 2024-11-14 MED ORDER — DEXAMETHASONE SODIUM PHOSPHATE 120 MG/30ML IJ SOLN
4.0000 mg | Freq: Once | INTRAMUSCULAR | Status: AC
  Administered 2024-11-14: 4 mg via INTRA_ARTICULAR

## 2024-11-14 MED ORDER — TRIAMCINOLONE ACETONIDE 10 MG/ML IJ SUSP
2.5000 mg | Freq: Once | INTRAMUSCULAR | Status: AC
  Administered 2024-11-14: 2.5 mg via INTRA_ARTICULAR

## 2024-11-14 NOTE — Telephone Encounter (Signed)
 Patient called answering service and stated that she no longer would be using Dupixent . Patient stated she did not want a call back and would discuss Dupixent  and other options at next appointment. Patient currently does not have an appointment scheduled.

## 2024-11-14 NOTE — Progress Notes (Signed)
"  °  Subjective:  Patient ID: Karen Meyer, female    DOB: Dec 30, 1985,   MRN: [id number]  Chief Complaint  Patient presents with   Plantar Fasciitis    It's not doing good, both of the foots are hurting bad.  I don't have balls of my foot anymore.  My heels hurt really bad.  They swell.    39 y.o. female presents for concern of bilateral plantar fasciitis.  She relates her feet are hurting very badly.  She relates the pain runs from her arch to her heels.  She relates she gets pain when walking on her feet and even when sitting down.  She does have a significant history of fibromyalgia and can attribute a lot of this pain to that.  She has been seen before for plantar fasciitis and had injections that did help for some time.  She is hoping injections today.  Denies any other pedal complaints. Denies n/v/f/c.   Past Medical History:  Diagnosis Date   Chronic pruritus    Fibromyalgia    Frequency of urination    GERD (gastroesophageal reflux disease)    Infertility, female    Interstitial cystitis    Migraine headache    Mild acid reflux    Nocturia    PONV (postoperative nausea and vomiting)    TMJ syndrome LEFT SIDE   Urgency of urination     Objective:  Physical Exam: Vascular: DP/PT pulses 2/4 bilateral. CFT <3 seconds. Normal hair growth on digits. No edema.  Skin. No lacerations or abrasions bilateral feet.  Musculoskeletal: MMT 5/5 bilateral lower extremities in DF, PF, Inversion and Eversion. Deceased ROM in DF of ankle joint.  Mildly tender to medial calcaneal tubercle bilateral.  No pain within the arch.  No pain elsewhere about the foot. Neurological: Sensation intact to light touch.   Assessment:   1. Plantar fasciitis, bilateral      Plan:  Patient was evaluated and treated and all questions answered. Discussed plantar fasciitis with patient.  X-rays reviewed and discussed with patient. No acute fractures or dislocations noted. Mild spurring noted at  inferior calcaneus.  Discussed treatment options including, ice, NSAIDS, supportive shoes, bracing, and stretching.  Discussed continuing stretching exercises and to really focus on this more often. Discussed inserts and in her case wearing more cushioned insoles and changing them out regularly. Follow-up 6 weeks or sooner if any problems arise. In the meantime, encouraged to call the office with any questions, concerns, change in symptoms.   Procedure:  Discussed etiology, pathology, conservative vs. surgical therapies. At this time a plantar fascial injection was recommended.  The patient agreed and a sterile skin prep was applied.  An injection consisting of  1cc dexamethasone  0.5 cc kenalog  and 1cc marcaine  mixture was infiltrated at the point of maximal tenderness on the bilateral Heel.  Bandaid applied. The patient tolerated this well and was given instructions for aftercare.    Asberry Failing, DPM    "

## 2024-11-14 NOTE — Patient Instructions (Signed)

## 2024-11-29 ENCOUNTER — Encounter: Payer: Self-pay | Admitting: Internal Medicine

## 2024-11-29 ENCOUNTER — Ambulatory Visit: Admitting: Internal Medicine

## 2024-11-29 VITALS — BP 124/82 | HR 96 | Temp 97.2°F | Resp 18 | Ht 64.5 in | Wt 123.4 lb

## 2024-11-29 DIAGNOSIS — K219 Gastro-esophageal reflux disease without esophagitis: Secondary | ICD-10-CM | POA: Diagnosis not present

## 2024-11-29 DIAGNOSIS — J387 Other diseases of larynx: Secondary | ICD-10-CM | POA: Diagnosis not present

## 2024-11-29 DIAGNOSIS — R4589 Other symptoms and signs involving emotional state: Secondary | ICD-10-CM

## 2024-11-29 DIAGNOSIS — T782XXA Anaphylactic shock, unspecified, initial encounter: Secondary | ICD-10-CM | POA: Diagnosis not present

## 2024-11-29 MED ORDER — MONTELUKAST SODIUM 10 MG PO TABS: 10.0000 mg | ORAL_TABLET | Freq: Every day | ORAL | 1 refills | Status: AC

## 2024-11-29 MED ORDER — CETIRIZINE HCL 10 MG PO TABS: 20.0000 mg | ORAL_TABLET | Freq: Two times a day (BID) | ORAL | 3 refills | Status: AC

## 2024-11-29 MED ORDER — FAMOTIDINE 20 MG PO TABS: 20.0000 mg | ORAL_TABLET | Freq: Two times a day (BID) | ORAL | 5 refills | Status: AC

## 2024-11-29 NOTE — Patient Instructions (Addendum)
 Anaphylaxis  Increase zyrtec  to 20mg  twice daily  Increase pepcid  to 20mg  twice daily  Start singulair  10mg  at night  Conitnue to carry epipen   Use pulse ox: if > 90% and use the highest number, try ILO exercise first for throat symptoms   If <90% use your epipen  and follow allergy action plan  Return in 2 weeks for drug challenge to xolair  Once we get you on xolair, we can look at reintroducing foods, further allergy testing etc  Will get updated tryptase and IgE today   Follow up: with [facility] for xolair challenge   Vocal Cord Dysfunction (VCD) /Inspiratory Laryngeal Obstruction (ILO)/ Exercise- Induced Laryngeal Obstruction Exercises (EILO)  Practice these techniques several times a day, so that when you need them, they will be automatic, and will work right away (or very fast) to open up the spasming (closed) vocal cords.  PREPARATION: ? Loosen clothing at waist, so nothing is tight or snug. Open top buttons of pants or skirt, unzip pant or skirt zippers, pull shirt out over pants or skirt. This prevents pressure on the stomach, which can cause stomach reflux (backup of corrosive liquid) into the esophagus. Gastric reflux is a frequent cause of VCD attacks. ? Sit in a comfortable chair. When you need to use these techniques, you may be standing, lying, or sitting-BUT first try to learn them while sitting because they are easier to learn in this position. ? Keep water  handy. Take sips, so your mouth and throat will not dry out. Swallow carefully, to avoid choking. ? Put your right (or left) thumb on your belly button, with the rest of your fingers below the thumb, so you are GENTLY touching your belly (lower abdomen). ? Doing this will focus your attention on your lower abdominal muscles. ? Relax your chest. Relax your shoulders. Relax your neck. Relax your throat. This helps you to try to use ONLY your belly (lower abdomen) muscles. Consciously try NOT to use chest muscles, etc. Chest  muscle use seems to irritate vocal cords while belly muscles tend to relax them.  BREATHE OUT (EXHALE) FIRST WITH slightly PURSED LIPS: ? Since most people cannot inhale, (breathe in), during VCD attacks, please start by breathing out (exhaling) in the special way described below, and this will usually open up the vocal cords, immediately. ? Start, by breathing out (exhaling) with lips pursed as if you are trying to gently blow out a candle. ? This is similar to whistling, but your lips will not be as puckered as when whistling and your lips will not be pushed forward, like when whistling. If you look in a mirror, you would see a mostly horizontal line of space between the lips, while you exhale the ffffffffffffffffffff. ? This may be silent, or may sound like a gentle wind or breeze, like fffffffffffffff and your lips should be symmetrical, around your teeth. You lower lip will not be touching your upper teeth, like when someone says the name FFFFFFFRank. This is one continuous flow of exhaled air, either silent, or making a slightly windy sound. ? Feel your hand on your belly (lower abdomen) come IN, a little bit toward your back, as you are working only your lower belly muscles. ? This abdominal exhaled fffffffffffffffff alone often stops VCD attacks very quickly.  ? Some prefer to try a variation of exhaling ffffffffffff. Try exhaling quickly, ffff, fffff, ffff--all part of one exhalation. Do not inhale in between quick fffs. This helps some people more quickly  open up the spasming vocal cords. This is like blowing out a slightly stubborn candle, exhaling against a tiny bit of resistance (like trying to blow out a trick birthday candle). ? If any of this helps, try to slow down the exhalations, and gently exhale ffffffffffffffffff. ? Some people prefer to exhale gently ssssssssssssssss or shhhhhhhhhhhhh rather than fffffffffffffff, etc. Choose what works best  in your case, or what is most comfortable for you. ? You may need to repeat exhaling ffffffffffffff (or ffff, ffff, ffff), etc, several times to relax the spasming vocal cords. Repeating this often stops a VCD attack so that you can inhale (breathe in) again.

## 2024-11-29 NOTE — Progress Notes (Signed)
 "  NEW PATIENT Date of Service/Encounter:  11/29/24 Referring provider: none-self referred Primary care provider: Patient, No Pcp Per  Subjective:  Karen Meyer is a 39 y.o. female  with PMHX of fibromyalgia, GERD,  presenting today for evaluation of anaphylaxis  History obtained from: chart review and patient.   Discussed the use of AI scribe software for clinical note transcription with the patient, who gave verbal consent to proceed.  History of Present Illness Karen Meyer is a 39 year old female who presents with severe allergic reactions to various foods and airborne allergens. She is accompanied by her father.  Food-induced allergic reactions - Long-standing history of allergic reactions beginning in childhood, initially presenting as perioral rashes after consuming fruits such as peaches and plums. - Progression to broader range of fruits with symptoms including throat burning, swelling, sense of doom, and feeling weird. - Since January 2025, escalation of symptoms with frequent reactions to foods, including throat tightness and sense of impending doom. - By July 2025, daily symptoms of difficulty breathing, described as 'breathing through a straw.' - In August 2025, severe reaction after ingesting a microscopic piece of tomato and pickle relish, resulting in tongue swelling, difficulty breathing, and ICU admission; received epinephrine  and other interventions, with resolution of symptoms the following morning.  -pictures show significant tongue angioedema   - tryptase obtained 5 hours after symptoms was 2.7 - Reactions do not always include hives; often experiences throat symptoms without hives or visible swelling. - Some reactions are manageable with antihistamines (Zyrtec , Benadryl), while others require emergency intervention. - Fear of food due to unpredictability of reactions, despite attempts to avoid known triggers and manage symptoms with  antihistamines.  Airborne allergen reactions - Reactions to airborne allergens such as cooking crab and eggs, described as feeling like 'eating poison,' causing throat tingling and tightness. - Avoids cooking certain foods at home due to risk of airborne reactions.  Cutaneous symptoms - History of eczema, managed with topical cream. - Initial allergic reactions presented as perioral rashes after fruit ingestion. - onset of eczema coincides with worsening food reactions   Gastrointestinal symptoms - History of gastroesophageal reflux disease (GERD). - Actively working on GERD management and avoiding certain foods.  Allergy testing and laboratory findings - Allergy testing positive for multiple allergens, including shellfish and various fruits. - Tryptase levels have remained normal during episodes of severe reactions. - [facility] up at [facility], had one dose of xolair and developed throat tightness within 15 minutes and treated with xolair x2.   - She is open to trying again   Medication use - Currently taking Zyrtec  10 mg twice daily and Pepcid  20 mg daily. - Uses Benadryl as needed for allergic reactions.    Chart Review:   ED visits: 09/22/2024, 11/15/2024, 11/17/2024 11/19/2024 -for anaphylaxis/angioedema.  Currently on Xolair, prior ED notes was treated with IM epinephrine  x 2, IV steroids and antihistamines.    -Per ED records on exam 1 note showed left-sided tongue swelling, others showed no abnormal physical exam findings Admission for airway watch 07/04/2024  Saw GI for dysphagia 11/20/24, EGD 11/24/24: 1. DUODENUM, DUODENUM-BIOPSY:  BENIGN DUODENAL MUCOSA, NEGATIVE FOR FEATURES OF CELIAC SPRUE.  NEGATIVE FOR  GIARDIA.   2. STOMACH, GASTRIC ANTRUM-BIOPSY:  CHRONIC INACTIVE GASTRITIS WITH REACTIVE EPITHELIUM. NEGATIVE FOR H.PYLORI ON  H&E STAIN.   3. STOMACH, GASTRIC BODY-BIOPSY:  CHRONIC INACTIVE GASTRITIS. NEGATIVE FOR H.PYLORI ON H&E STAIN.   4. ESOPHAGEAL,  ESOPHAGUS-BIOPSY:  SQUAMOUS MUCOSA WITH NO  SIGNIFICANT PATHOLOGIC ABNORMALITY. NEGATIVE FOR  INTESTINAL METAPLASIA.  NEGATIVE FOR EOSINOPHILIC ESOPHAGITIS.  (CRR)   Past Medical History: Past Medical History:  Diagnosis Date   Chronic pruritus    Fibromyalgia    Frequency of urination    GERD (gastroesophageal reflux disease)    Infertility, female    Interstitial cystitis    Migraine headache    Mild acid reflux    Nocturia    PONV (postoperative nausea and vomiting)    TMJ syndrome LEFT SIDE   Urgency of urination    Medication List:  Current Outpatient Medications  Medication Sig Dispense Refill   cetirizine  (ZYRTEC ) 10 MG tablet Take 2 tablets (20 mg total) by mouth 2 (two) times daily. 180 tablet 3   clobetasol  ointment (TEMOVATE ) 0.05 % Apply 1 Application topically 2 (two) times daily. 30 g 0   Dupilumab  (DUPIXENT ) 300 MG/2ML SOAJ Inject 300 mg into the skin every 14 (fourteen) days. Starting at day 15 for maintenance. 4 mL 12   EPINEPHrine  0.3 mg/0.3 mL IJ SOAJ injection Inject 0.3 mg into the muscle.     esomeprazole (NEXIUM) 40 MG capsule Take 40 mg by mouth.     famotidine  (PEPCID ) 20 MG tablet Take 1 tablet (20 mg total) by mouth 2 (two) times daily. 60 tablet 5   montelukast  (SINGULAIR ) 10 MG tablet Take 1 tablet (10 mg total) by mouth at bedtime. 90 tablet 1   naproxen  (NAPROSYN ) 500 MG tablet every 12 (twelve) hours as needed. take with food or milk     omeprazole (PRILOSEC) 20 MG capsule Take by mouth.     predniSONE  (DELTASONE ) 10 MG tablet Take 1 tablet (10 mg total) by mouth daily with breakfast. Take 4 tablets (40 mg) by mouth each morning on days 1 and 2, then take 3 tablets (30 mg) each morning on days 3 and 4, then take 2 tablets (20 mg) each morning on days 5 and 6, then take 1 tablet (10 mg) in the morning on day 7. (Patient not taking: Reported on 11/29/2024) 10 tablet 0   ranitidine (ZANTAC) 150 MG tablet Take 75 mg by mouth.     No current  facility-administered medications for this visit.   Facility-Administered Medications Ordered in Other Visits  Medication Dose Route Frequency Provider Last Rate Last Admin   bupivacaine  (MARCAINE ) 0.25 % (with pres) injection 30 mL  30 mL Infiltration Once Grapey, David, MD       oxychlorosene (CLORPACTIN WCS 90) powder   Irrigation Once Grapey, David, MD       Known Allergies:  Allergies[1] Past Surgical History: Past Surgical History:  Procedure Laterality Date   CESAREAN SECTION  2007   CYSTO WITH HYDRODISTENSION  08/16/2012   Procedure: CYSTOSCOPY/HYDRODISTENSION;  Surgeon: Alm GORMAN Fragmin, MD;  [facility];  Service: Urology;  Laterality: N/A;   CYSTO/ HOD/ INSTILLATION CLORPACTIN  03-10-2011;  MAR 2011;  2009   FOR [facility]   Family History: Family History  Problem Relation Age of Onset   Diabetes Mother    Cancer Mother 11       76; BREAST   Diverticulosis Mother    Hyperlipidemia Father    Social History: Yanelly lives condo.  No water  damage.  Vinyl flooring throughout.  Electric heating and central cooling.  No pets..   ROS:  All other systems negative except as noted per HPI.  Objective:  Blood pressure 124/82, pulse 96, temperature (!) 97.2 F (36.2 C), temperature source  Temporal, resp. rate 18, height 5' 4.5 (1.638 m), weight 123 lb 6.4 oz (56 kg), last menstrual period 11/02/2024, SpO2 100%. Body mass index is 20.85 kg/m. Physical Exam:  General Appearance:  Alert, cooperative, no distress, appears stated age, pale appearing   Head:  Normocephalic, without obvious abnormality, atraumatic  Eyes:  Conjunctiva clear, EOM's intact  Ears EACs normal bilaterally and normal TMs bilaterally  Nose: Nares normal, normal mucosa, no visible anterior polyps, and septum midline  Throat: Lips, tongue normal; teeth and gums normal, normal posterior oropharynx  Neck: Supple, symmetrical  Lungs:   clear to auscultation bilaterally, Respirations  unlabored, no coughing  Heart:  regular rate and rhythm and no murmur, Appears well perfused  Extremities: No edema  Skin: Skin color, texture, turgor normal and no rashes or lesions on visualized portions of skin  Neurologic: No gross deficits   Diagnostics: None done    Labs:  Lab Orders         Tryptase         IgE       Assessment and Plan  Assessment and Plan Assessment & Plan 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 Anaphylaxis episodes with normal tryptase levels suggest possible non-allergic components. Differential includes inducible laryngeal obstruction GERD and food phobia. Xolair considered to raise reaction threshold as unclear previous reaction is truly related to xolair.    PLAN:   Increase zyrtec  to 20mg  twice daily  Increase pepcid  to 20mg  twice daily  Start singulair  10mg  at night  Conitnue to carry epipen   Use pulse ox: if > 90% and use the highest number, try ILO exercise first for throat symptoms   If <90% use your epipen  and follow allergy action plan  Return in 2 weeks for drug challenge to xolair  Once we get you on xolair, we can look at reintroducing foods, further allergy testing etc  Will get updated tryptase and IgE today   Follow up: with ME for xolair challenge   Recording 47 minutes   Total Time: 75  Time spent on day of service preparing to see patient, obtaining and reviewing separately obtained history, performing examination, counseling and educating patient and family, ordering medications, tests and procedures, referring and communicating with other health professionals, documenting clinical information in the health record, independently interpreting results and communicating to patient/family and care coordination.     This note in its entirety was forwarded to the Provider who requested this  consultation.  Other: reviewed spirometry technique and reviewed inhaler technique  Thank you for your kind referral. I appreciate the opportunity to take part in Estreya's care. Please do not hesitate to contact me with questions.  Sincerely,  Thank you so much for letting me partake in your care today.  Don't hesitate to reach out if you have any additional concerns!  Hargis Springer, MD  [facility] [place], [place]          [1]  Allergies Allergen Reactions   Cashew Nut Oil Anaphylaxis   Cymbalta [Duloxetine Hcl] Shortness Of Breath    Difficulty Breathing    Fruit Extracts Anaphylaxis   Peanut-Containing Drug Products Anaphylaxis   Shellfish Protein-Containing Drug Products Anaphylaxis   Propoxyphene Hives   Darvocet [Propoxyphene N-Acetaminophen ] Hives   Niacin And Related Hives   "

## 2024-12-02 LAB — TRYPTASE: Tryptase: 2.9 ug/L (ref 2.2–13.2)

## 2024-12-02 LAB — IGE: IgE (Immunoglobulin E), Serum: 1516 [IU]/mL — AB (ref 6–495)

## 2024-12-13 ENCOUNTER — Ambulatory Visit: Admitting: Internal Medicine

## 2024-12-13 ENCOUNTER — Encounter: Admitting: Internal Medicine

## 2024-12-14 ENCOUNTER — Ambulatory Visit: Payer: Self-pay | Admitting: Internal Medicine

## 2024-12-14 NOTE — Progress Notes (Signed)
 I would like to schedule this patient for a xolair challenge.  Dose would be 600mg  every 2 weeks.  If she passes drug challenge with samples, we can resubmit for approval

## 2024-12-29 ENCOUNTER — Ambulatory Visit: Admitting: Podiatry
